# Patient Record
Sex: Female | Born: 1955 | Race: Asian | Hispanic: No | Marital: Married | Smoking: Never smoker
Health system: Southern US, Community
[De-identification: ages and names within clinical notes are randomized; demographics above are authoritative.]

## PROBLEM LIST (undated history)

## (undated) DIAGNOSIS — Z8719 Personal history of other diseases of the digestive system: Secondary | ICD-10-CM

## (undated) DIAGNOSIS — G43909 Migraine, unspecified, not intractable, without status migrainosus: Secondary | ICD-10-CM

## (undated) HISTORY — DX: Personal history of other diseases of the digestive system: Z87.19

---

## 2005-04-10 ENCOUNTER — Ambulatory Visit (HOSPITAL_COMMUNITY): Admission: RE | Admit: 2005-04-10 | Discharge: 2005-04-10 | Payer: Self-pay | Admitting: Obstetrics and Gynecology

## 2005-08-09 ENCOUNTER — Emergency Department (HOSPITAL_COMMUNITY): Admission: EM | Admit: 2005-08-09 | Discharge: 2005-08-09 | Payer: Self-pay | Admitting: Family Medicine

## 2006-04-08 ENCOUNTER — Emergency Department (HOSPITAL_COMMUNITY): Admission: EM | Admit: 2006-04-08 | Discharge: 2006-04-08 | Payer: Self-pay | Admitting: Emergency Medicine

## 2006-04-14 ENCOUNTER — Emergency Department (HOSPITAL_COMMUNITY): Admission: EM | Admit: 2006-04-14 | Discharge: 2006-04-14 | Payer: Self-pay | Admitting: Emergency Medicine

## 2006-04-20 ENCOUNTER — Ambulatory Visit: Payer: Self-pay | Admitting: Family Medicine

## 2006-04-28 ENCOUNTER — Ambulatory Visit: Payer: Self-pay | Admitting: Internal Medicine

## 2006-04-29 ENCOUNTER — Ambulatory Visit: Payer: Self-pay | Admitting: *Deleted

## 2006-06-18 ENCOUNTER — Ambulatory Visit: Payer: Self-pay | Admitting: Family Medicine

## 2006-06-27 ENCOUNTER — Emergency Department (HOSPITAL_COMMUNITY): Admission: EM | Admit: 2006-06-27 | Discharge: 2006-06-28 | Payer: Self-pay

## 2006-07-03 ENCOUNTER — Emergency Department (HOSPITAL_COMMUNITY): Admission: EM | Admit: 2006-07-03 | Discharge: 2006-07-03 | Payer: Self-pay | Admitting: Family Medicine

## 2006-07-04 ENCOUNTER — Emergency Department (HOSPITAL_COMMUNITY): Admission: EM | Admit: 2006-07-04 | Discharge: 2006-07-04 | Payer: Self-pay | Admitting: Emergency Medicine

## 2006-08-05 ENCOUNTER — Emergency Department (HOSPITAL_COMMUNITY): Admission: EM | Admit: 2006-08-05 | Discharge: 2006-08-05 | Payer: Self-pay | Admitting: Physician Assistant

## 2006-08-12 ENCOUNTER — Ambulatory Visit: Payer: Self-pay | Admitting: Family Medicine

## 2006-09-28 ENCOUNTER — Ambulatory Visit: Payer: Self-pay | Admitting: Family Medicine

## 2007-06-09 ENCOUNTER — Encounter (INDEPENDENT_AMBULATORY_CARE_PROVIDER_SITE_OTHER): Payer: Self-pay | Admitting: *Deleted

## 2008-07-24 ENCOUNTER — Ambulatory Visit: Payer: Self-pay | Admitting: Adult Health

## 2008-07-24 LAB — CONVERTED CEMR LAB
ALT: 117 units/L — ABNORMAL HIGH (ref 0–35)
AST: 85 units/L — ABNORMAL HIGH (ref 0–37)
Albumin: 4.7 g/dL (ref 3.5–5.2)
Basophils Absolute: 0 10*3/uL (ref 0.0–0.1)
Basophils Relative: 1 % (ref 0–1)
CO2: 24 meq/L (ref 19–32)
Calcium: 9.8 mg/dL (ref 8.4–10.5)
Chloride: 105 meq/L (ref 96–112)
Creatinine, Ser: 0.74 mg/dL (ref 0.40–1.20)
Lymphocytes Relative: 32 % (ref 12–46)
MCHC: 33.3 g/dL (ref 30.0–36.0)
Neutro Abs: 3.1 10*3/uL (ref 1.7–7.7)
Neutrophils Relative %: 56 % (ref 43–77)
Platelets: 215 10*3/uL (ref 150–400)
Potassium: 3.8 meq/L (ref 3.5–5.3)
RDW: 12.5 % (ref 11.5–15.5)
Sodium: 142 meq/L (ref 135–145)
Total Protein: 8 g/dL (ref 6.0–8.3)

## 2008-07-26 ENCOUNTER — Encounter (INDEPENDENT_AMBULATORY_CARE_PROVIDER_SITE_OTHER): Payer: Self-pay | Admitting: Adult Health

## 2008-07-26 LAB — CONVERTED CEMR LAB: Hep B S Ab: POSITIVE — AB

## 2008-11-24 ENCOUNTER — Emergency Department (HOSPITAL_COMMUNITY): Admission: EM | Admit: 2008-11-24 | Discharge: 2008-11-24 | Payer: Self-pay | Admitting: Family Medicine

## 2011-01-02 LAB — POCT I-STAT, CHEM 8
BUN: 10 mg/dL (ref 6–23)
Chloride: 105 mEq/L (ref 96–112)
Glucose, Bld: 98 mg/dL (ref 70–99)
HCT: 48 % — ABNORMAL HIGH (ref 36.0–46.0)
Potassium: 4 mEq/L (ref 3.5–5.1)

## 2011-02-07 NOTE — Assessment & Plan Note (Signed)
[facility]                           GASTROENTEROLOGY OFFICE NOTE   NAME:Karen Lee, Karen Lee                            MRN:          [id number]  DATE:04/28/2006                            DOB:          [birth year]    REASON FOR CONSULTATION:  Epigastric pain.   HISTORY:  This is a 55 year old non-English-speaking [place] female who  is referred through the [facility] Emergency Room for  evaluation of epigastric pain.  The patient was evaluated in the emergency  room on July 18, with headache and epigastric pain.  She was evaluated again  April 14, 2006, with epigastric pain.  The patient reports the discomfort has  been present for two to three weeks.  It is moderate in degree.  There is  intermittent nausea and vomiting on one occasion without blood.  Symtposm  seems to be worse at night.  It is not clear that her symptoms are affected  by meals.  There has been some bloating, as well.  The pain is focal and  nonradiating.  There is some heartburn and indigestion.  No dysphagia.  Appetite is stable.  Weight is stable.  No melena.  Bowel habits are  regular.  She was placed on Protonix via [facility].  She has been taking  40 mg daily and thinks this has been helpful.  The symptoms have not  entirely resolved.  As part of her workup, she underwent an abdominal  ultrasound  April 14, 2006.  This was normal.  CBC obtained July 18 and July  24, were both normal with a hemoglobin of 14.8.  As well, most recent  Comprehensive Metabolic Panel, including liver function tests, amylase and  lipase, as well as urinalysis, were unremarkable.  Helicobacter pylori  antibody was normal.   PAST MEDICAL HISTORY:  None known.   PAST SURGICAL HISTORY:  None reported.   ALLERGIES:  HYDROCODONE.   CURRENT MEDICATIONS:  1.  Protonix 40 mg daily.  2.  Benadryl 50 mg at night.  3.  Tylenol p.r.n.   FAMILY HISTORY:  The patient denies family history of  gastrointestinal  malignancy.   SOCIAL HISTORY:  The patient is married with five children.  She is  accompanied by her husband.  They are farmers by occupation.  She is a  [place] native and has been in the [place] approximately one year.  She does not smoke or use alcohol.   REVIEW OF SYSTEMS:  Per diagnostic evaluation form.   PHYSICAL EXAMINATION:  GENERAL:  Well-appearing female in no acute distress.  VITAL SIGNS:  Blood pressure 116/670, heart rate 60, weight 110.8 pounds.  She is 5 feet in height.  HEENT:  Sclerae anicteric.  Conjunctivae pink.  Oral mucosa intact.  No  adenopathy.  LUNGS:  Clear.  HEART:  Regular.  ABDOMEN:  Soft with tenderness, mass, or hernia.  Good bowel sounds heard.  EXTREMITIES:  Without edema.   IMPRESSION:  This is a 55 year old female with several-week history of  epigastric discomfort.  She does have reflux symptoms which may explain  her  pain.  Alternatively, she could have an ulcer, though Helicobacter pylori  antibody was negative.  In any event, she is improved on proton pump  inhibitor therapy.  I discussed today with the patient and her husband  additional workup including endoscopic evaluation.  However, they are  concerned about the cost and would prefer empiric therapy.  I think this is  reasonable as there are no alarm symptoms.  As such, I have provided her  with an additional six weeks of Protonix samples which she should take  daily.  If the symptoms do not resolve, then I think upper endoscopy would  be indicated.  All the above evaluation has been performed with the  assistance of a [place].  The patient and her husband clearly  understand the above impression and plans.  She will resume her general  medical care with [facility].                                   Wilhemina Bonito. Eda Keys., MD   JNP/MedQ  DD:  04/28/2006  DT:  04/28/2006  Job #:  [id number]   cc:   [employer]

## 2016-09-22 DIAGNOSIS — Z8719 Personal history of other diseases of the digestive system: Secondary | ICD-10-CM

## 2016-09-22 HISTORY — DX: Personal history of other diseases of the digestive system: Z87.19

## 2017-10-16 ENCOUNTER — Encounter: Payer: Self-pay | Admitting: Internal Medicine

## 2017-10-16 ENCOUNTER — Ambulatory Visit: Payer: Self-pay | Admitting: Internal Medicine

## 2017-10-16 VITALS — BP 140/80 | HR 60 | Resp 12 | Ht 59.0 in | Wt 133.0 lb

## 2017-10-16 DIAGNOSIS — K140 Glossitis: Secondary | ICD-10-CM

## 2017-10-16 NOTE — Progress Notes (Signed)
   Subjective:    Patient ID: Karen Lee, female    DOB: 07/26/1956, 62 y.o.   MRN: [id number]  HPI   Here to establish  Tongue pain on and off for 2-3 years.  6 days ago, she developed tongue pain episode.  This seems to occur every 2 weeks. So, basically, her tongue is painful every other week.  Has not had this evaluated anywhere else.   She has not been able to associate the pain with anything she eats or does sets this off. Does not use any medications. Was recommended to start Super B Complex with C  for possible glossitis by a friend who is a physician. No corticosteroid inhalers, no oral prednisone or antibiotics recently  No history of blood transfusion.  Eats only once to twice daily.  Generally salad with multiple vegetables and chicken.  Will eat rice and vegetables also.  Later, may eat 3 times daily, but tongue pain may limit. She does not feel she has lost weight with this.  Current Meds  Medication Sig  . B Complex-C (SUPER B COMPLEX/VITAMIN C PO) Take by mouth daily.    No Known Allergies  Social History   Socioeconomic History  . Marital status: Married    Spouse name: Not on file  . Number of children: 5  . Years of education: 0  . Highest education level: Not on file  Social Needs  . Financial resource strain: Not hard at all  . Food insecurity - worry: Never true  . Food insecurity - inability: Never true  . Transportation needs - medical: No  . Transportation needs - non-medical: No  Occupational History  . 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 Smoking status: Never Smoker  . Smokeless tobacco: Never Used  Substance and Sexual Activity  . Alcohol use: No    Frequency: Never  . Drug use: No  . Sexual activity: Not on file  Other Topics Concern  . Not on file  Social History Narrative  . Not on file     Review of Systems     Objective:   Physical Exam  NAD HEENT: PERRL, EOMI, TMs pearly gray, throat  without injection.  Mild inflammation/erythema along edge of tongue at distal aspect of tongue.  The inflammation extends along the curve of tongue tip on both sides for about 2 cm.  No exudate on tongue or buccal mucosa. Does have diffuse dental decay. No significant inflammation noted at labial folds bilaterally. Neck:  Supple, No adenopathy Chest:  CTA CV:  RRR with normal S1 and S2, No S3, S4 or murmur.  Radial and DP pulses normal and equal Abd:  S, NT, No HSM or mass, + BS Skin:  No rash or lesion.        Assessment & Plan:  1.  Tongue inflammation and pain:  Does not really appear to be fungal in origin.   Not clear she has cheilosis along with tongue irritation. Check RBC Folate, vitamin B12 level, CMP, CBC. HIV as well. To take Super B complex daily and follow up in a couple of months to see if any improvement. Call if worsens

## 2017-10-16 NOTE — Progress Notes (Signed)
Social Worker Intern performed a patient screening with an interpreter. The patient screening assess symptoms of depression and social determinant os health. Pt. Disclosed having trouble concentrating on things from time to time. No other concerns were registered. Social TEFL teacherWorker intern offered counseling services, but pt declined.

## 2017-10-16 NOTE — Patient Instructions (Signed)
Recommend taking B complex with 1 mg of Folic Acid (or at least 800 mcg daily)

## 2017-10-19 LAB — CBC WITH DIFFERENTIAL/PLATELET
BASOS: 1 %
Basophils Absolute: 0.1 10*3/uL (ref 0.0–0.2)
EOS (ABSOLUTE): 0.1 10*3/uL (ref 0.0–0.4)
Eos: 3 %
Hematocrit: 44.1 % (ref 34.0–46.6)
Hemoglobin: 14.8 g/dL (ref 11.1–15.9)
IMMATURE GRANULOCYTES: 1 %
Immature Grans (Abs): 0 10*3/uL (ref 0.0–0.1)
Lymphocytes Absolute: 1.5 10*3/uL (ref 0.7–3.1)
Lymphs: 28 %
MCH: 30 pg (ref 26.6–33.0)
MCHC: 33.6 g/dL (ref 31.5–35.7)
MCV: 90 fL (ref 79–97)
Monocytes Absolute: 0.6 10*3/uL (ref 0.1–0.9)
Monocytes: 11 %
NEUTROS PCT: 56 %
Neutrophils Absolute: 3.1 10*3/uL (ref 1.4–7.0)
Platelets: 201 10*3/uL (ref 150–379)
RBC: 4.93 x10E6/uL (ref 3.77–5.28)
RDW: 13.8 % (ref 12.3–15.4)
WBC: 5.4 10*3/uL (ref 3.4–10.8)

## 2017-10-19 LAB — COMPREHENSIVE METABOLIC PANEL
ALT: 53 IU/L — ABNORMAL HIGH (ref 0–32)
AST: 38 IU/L (ref 0–40)
Albumin/Globulin Ratio: 1.6 (ref 1.2–2.2)
Albumin: 4.9 g/dL — ABNORMAL HIGH (ref 3.6–4.8)
Alkaline Phosphatase: 65 IU/L (ref 39–117)
BILIRUBIN TOTAL: 0.5 mg/dL (ref 0.0–1.2)
BUN/Creatinine Ratio: 18 (ref 12–28)
BUN: 15 mg/dL (ref 8–27)
CHLORIDE: 100 mmol/L (ref 96–106)
CO2: 26 mmol/L (ref 20–29)
Calcium: 9.8 mg/dL (ref 8.7–10.3)
Creatinine, Ser: 0.84 mg/dL (ref 0.57–1.00)
GFR calc Af Amer: 86 mL/min/{1.73_m2} (ref >59)
GFR calc non Af Amer: 75 mL/min/{1.73_m2} (ref >59)
GLOBULIN, TOTAL: 3.1 g/dL (ref 1.5–4.5)
Glucose: 96 mg/dL (ref 65–99)
POTASSIUM: 4.4 mmol/L (ref 3.5–5.2)
SODIUM: 141 mmol/L (ref 134–144)
Total Protein: 8 g/dL (ref 6.0–8.5)

## 2017-10-19 LAB — FOLATE RBC
Folate, Hemolysate: 380.4 ng/mL
Folate, RBC: 863 ng/mL (ref >498)

## 2017-10-19 LAB — HIV ANTIBODY (ROUTINE TESTING W REFLEX): HIV Screen 4th Generation wRfx: NONREACTIVE

## 2017-10-19 LAB — VITAMIN B12: VITAMIN B 12: 821 pg/mL (ref 232–1245)

## 2017-10-27 ENCOUNTER — Ambulatory Visit (INDEPENDENT_AMBULATORY_CARE_PROVIDER_SITE_OTHER): Payer: Self-pay | Admitting: Internal Medicine

## 2017-10-27 ENCOUNTER — Encounter: Payer: Self-pay | Admitting: Internal Medicine

## 2017-10-27 VITALS — BP 130/88 | HR 64 | Resp 12 | Ht 59.0 in | Wt 133.0 lb

## 2017-10-27 DIAGNOSIS — K140 Glossitis: Secondary | ICD-10-CM

## 2017-10-27 MED ORDER — FLUCONAZOLE 100 MG PO TABS: ORAL_TABLET | ORAL | 0 refills | Status: DC

## 2017-10-27 NOTE — Patient Instructions (Signed)
Use lip balm without lanolin or smell or taste.

## 2017-10-27 NOTE — Progress Notes (Signed)
   Subjective:    Patient ID: Karen Lee, female    DOB: 10/08/1955, 62 y.o.   MRN: [id number]  HPI   Here today as her tongue became much worse yesterday morning.  I wanted to see what it looked like when she is having a flare. She does not recall any particular exposure before the inflammation of her tongue worsened. She has been taking the Super B complex.  Her husband accompanies her today.  Current Meds  Medication Sig  . B Complex-C (SUPER B COMPLEX/VITAMIN C PO) Take by mouth daily.  . ranitidine (ZANTAC) 150 MG tablet Take 150 mg by mouth 2 (two) times daily.      Review of Systems     Objective:   Physical Exam HEENT:  Edge of tip of tongue again a bit reddened with papillae more prominent.  No exudate on tongue or mucosa.  Generalized dental decay. Neck:  Supple, no adenopathy Chest : CTA CV:  RRR without murmur or rub.       Assessment & Plan:  Recurrent tongue inflammation mainly at tip edges. Will try a course of Fluconazole 100 mg daily for 14 days. Follow up subsequent to finishing course. ENT referral if does not resolve. Needs orange cared fo dental referral

## 2017-11-24 ENCOUNTER — Ambulatory Visit: Payer: Self-pay | Admitting: Internal Medicine

## 2017-11-24 ENCOUNTER — Encounter: Payer: Self-pay | Admitting: Internal Medicine

## 2017-11-24 VITALS — BP 130/90 | HR 66 | Resp 12 | Ht 59.0 in | Wt 136.0 lb

## 2017-11-24 DIAGNOSIS — K140 Glossitis: Secondary | ICD-10-CM

## 2017-11-24 NOTE — Progress Notes (Signed)
   Subjective:    Patient ID: Karen Lee, female    DOB: 03/16/1956, 62 y.o.   MRN: [id number]  HPI   Tongue inflammation and pain:  States much better after 14 day treatment of Fluconazole for possible fungal etiology.  Finished about 1 week ago.    No outpatient medications have been marked as taking for the 11/24/17 encounter (Office Visit) with Julieanne MansonMulberry, Payslie Mccaig, MD.    No Known Allergies  Review of Systems     Objective:   Physical Exam  No inflammation of tongue, just some hyperpigmentation left from where inflamed on tip previously.      Assessment & Plan:  Tongue Inflammation:  Resolved with 14 day course of Fluconazole. To call if recurs--may need to send to ENT.

## 2017-12-15 ENCOUNTER — Encounter: Payer: Self-pay | Admitting: Internal Medicine

## 2017-12-15 ENCOUNTER — Ambulatory Visit: Payer: Self-pay | Admitting: Internal Medicine

## 2017-12-15 VITALS — BP 130/80 | HR 60 | Resp 12 | Ht 59.0 in | Wt 135.0 lb

## 2017-12-15 DIAGNOSIS — K120 Recurrent oral aphthae: Secondary | ICD-10-CM

## 2017-12-15 MED ORDER — LIDOCAINE VISCOUS 2 % MT SOLN
OROMUCOSAL | 0 refills | Status: DC
Start: 2017-12-15 — End: 2018-02-24

## 2017-12-15 NOTE — Patient Instructions (Signed)
Brush teeth 3 times daily, but find a toothpaste without sodium laurel sulfate. Use mouthwash after brushing that does not contain alcohol Apply the 2% viscous lidocaine just to the area that is red and swollen and painful with a Qtip. Get your teeth cleaned by a dental clinic or [facility] dental hygienist program at least twice yearly and ask them if your teeth on the areas where you get the sores are sharp and irritating tongue--perhaps they can smooth the tooth surface out

## 2017-12-15 NOTE — Progress Notes (Signed)
   Subjective:    Patient ID: Karen Lee, female    DOB: 07-25-1956, 62 y.o.   MRN: [id number]  HPI   Patient seen on 11/24/2017 after 14 day course of oral Fluconazole and was doing well.  Last Friday, 5 days ago, she developed soreness on lateral right tongue and small white area on tongue at source of soreness. Has worsened with time, but remains only on right side.   Denies any history of other mucosal lesions:  Genital/anal, eyes, nasal. No skin rashes. Rarely has some bilateral lower abdominal pain.  Lasts only 15-20 minutes and then gone. No diarrhea, melena, or hematochezia No inflamed or swollen, painful joints.   Never with ulcerative lesions on outside of lips, but may have on mucosal side of lips. No fever or general malaise with the ulcers.    Dental care:  Brushes teeth 3 times daily  No outpatient medications have been marked as taking for the 12/15/17 encounter (Office Visit) with Mack Hook, MD.    No Known Allergies  Review of Systems     Objective:   Physical Exam NAD HEENT:  PERRL, EOMI, conjuntivae without injection, TMs pearly gray.  MMM.  3m ulcerative lesion on right mid lateral aspect of tongue with whitening of surrounding tissue and underlying mild inflammation.  No other oral lesions noted. Teeth with some decay and sharp areas on lingual aspect. Neck:  Supple, No adenopathy Chest:  CTA CV:  RRR without murmur or rub. Skin;  No rash.       Assessment & Plan:  1.  Aphthous ulcer of tongue:  Check ESR and ANA.  Viscous Lidocaine for control of pain. Discussed good dental care and will also refer to Dental to assess whether mechanical issue of tongue against teeth may be setting her up for these. Discussed dental products without laurel sulfate for use as well. Patient previously assessed for DM, vitamin deficiency, CBC, CMP for etiology for this without findings.

## 2017-12-16 LAB — ANA: Anti Nuclear Antibody(ANA): NEGATIVE

## 2017-12-16 LAB — SEDIMENTATION RATE: Sed Rate: 9 mm/hr (ref 0–40)

## 2017-12-21 ENCOUNTER — Other Ambulatory Visit: Payer: Self-pay

## 2018-01-07 ENCOUNTER — Telehealth: Payer: Self-pay

## 2018-01-07 NOTE — Telephone Encounter (Signed)
Husband called in stating patient is having really bad mouth pain. Husband was asking for appointment. States the Lidocaine solution is not working.   To Dr. Delrae AlfredMulberry to advise

## 2018-01-07 NOTE — Telephone Encounter (Signed)
Attempted call back to get more information.  Left message to call clinic and will come to me.

## 2018-01-17 DIAGNOSIS — K120 Recurrent oral aphthae: Secondary | ICD-10-CM | POA: Insufficient documentation

## 2018-01-28 NOTE — Telephone Encounter (Signed)
Please call this week and see how she is doing.  Tried to get hold of them 3 weeks ago and never received a return call.

## 2018-02-04 NOTE — Telephone Encounter (Signed)
Left message to call the office  and let us know how she is doing

## 2018-02-24 ENCOUNTER — Ambulatory Visit: Payer: Self-pay | Admitting: Internal Medicine

## 2018-02-24 ENCOUNTER — Encounter: Payer: Self-pay | Admitting: Internal Medicine

## 2018-02-24 VITALS — BP 122/80 | HR 72 | Resp 12 | Ht 59.0 in | Wt 131.0 lb

## 2018-02-24 DIAGNOSIS — Z8719 Personal history of other diseases of the digestive system: Secondary | ICD-10-CM | POA: Insufficient documentation

## 2018-02-24 DIAGNOSIS — K120 Recurrent oral aphthae: Secondary | ICD-10-CM

## 2018-02-24 DIAGNOSIS — Z13220 Encounter for screening for lipoid disorders: Secondary | ICD-10-CM

## 2018-02-24 DIAGNOSIS — Z1231 Encounter for screening mammogram for malignant neoplasm of breast: Secondary | ICD-10-CM

## 2018-02-24 DIAGNOSIS — R740 Nonspecific elevation of levels of transaminase and lactic acid dehydrogenase [LDH]: Secondary | ICD-10-CM

## 2018-02-24 DIAGNOSIS — Z1239 Encounter for other screening for malignant neoplasm of breast: Secondary | ICD-10-CM

## 2018-02-24 DIAGNOSIS — Z124 Encounter for screening for malignant neoplasm of cervix: Secondary | ICD-10-CM

## 2018-02-24 DIAGNOSIS — H7293 Unspecified perforation of tympanic membrane, bilateral: Secondary | ICD-10-CM

## 2018-02-24 DIAGNOSIS — R7401 Elevation of levels of liver transaminase levels: Secondary | ICD-10-CM

## 2018-02-24 DIAGNOSIS — Z0000 Encounter for general adult medical examination without abnormal findings: Secondary | ICD-10-CM

## 2018-02-24 LAB — POCT WET PREP WITH KOH
Clue Cells Wet Prep HPF POC: NEGATIVE
KOH PREP POC: NEGATIVE
RBC Wet Prep HPF POC: NEGATIVE
TRICHOMONAS UA: NEGATIVE
YEAST WET PREP PER HPF POC: NEGATIVE

## 2018-02-24 NOTE — Progress Notes (Signed)
Subjective:    Patient ID: Karen Lee, female    DOB: 03/20/1956, 62 y.o.   MRN: [id number]  HPI   CPE with pap Husband interpreting.  1.  Pap:  Has never had a pap smear.  Has been married to her husband since 1982-83.  No family history known for cervical cancer.  2.  Mammogram:  Only mammogram was 2006 and normal.  No family history of breast cancer.  Patient with history of right mastitis back in 1985.  3.  Osteoprevention:  No dairy products.  No family history of changes associated with osteoporosis and fractures of vertebrae or hip/wrist.  Outside in garden at times.  Also, walks in park 1.5 hours daily.  4.  Guaiac Cards:  Never.    5.  Colonoscopy:  Never.  No family history of colon cancer.  6.  Immunizations:  Did get immunizations before in 2005, but none since.     7.  Glucose/Cholesterol:  Blood sugar nonfasting below 100 in January.  Has not had cholesterol checked here.  Fasting today.  No outpatient medications have been marked as taking for the 02/24/18 encounter (Office Visit) with Julieanne MansonMulberry, Alexzandrea Normington, MD.   No Known Allergies   Past Medical History:  Diagnosis Date  . H/O oral aphthous ulcers 2018    History reviewed. No pertinent surgical history.   History reviewed. No pertinent family history.  Patient not aware of what caused death of her mother.  Her mother never really saw a doctor and just was ill in bed at end of life.  Social History   Socioeconomic History  . Marital status: Married    Spouse name: Mem  . Number of children: 5  . Years of education: 0  . Highest education level: Not on file  Occupational History  . 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 Financial resource strain: Not hard at all  . Food insecurity:    Worry: Never true    Inability: Never true  . Transportation needs:    Medical: No    Non-medical: No  Tobacco Use  . Smoking status: Never Smoker  . Smokeless tobacco: Never Used    Substance and Sexual Activity  . Alcohol use: No    Frequency: Never  . Drug use: No  . Sexual activity: Yes    Birth control/protection: None  Lifestyle  . Physical activity:    Days per week: 7 days    Minutes per session: 70 min  . Stress: Not at all  Relationships  . Social connections:    Talks on phone: Not on file    Gets together: Not on file    Attends religious service: Not on file    Active member of club or organization: Not on file    Attends meetings of clubs or organizations: Not on file    Relationship status: Not on file  . Intimate partner violence:    Fear of current or ex partner: No    Emotionally abused: No    Physically abused: Not on file    Forced sexual activity: Not on file  Other Topics Concern  . Not on file  Social History Narrative   In [place] 2005   Has not taken ESOL.  No transportation, but discussed different places where she could go.   Lives at home with 3 youngest children and her husband, who is custodian at [employer]    Review of Systems  All other systems reviewed and are negative.      Objective:   Physical Exam  Constitutional: She is oriented to person, place, and time. She appears well-developed and well-nourished.  HENT:  Head: Normocephalic and atraumatic.  Right Ear: Hearing, external ear and ear canal normal.  Left Ear: Hearing, external ear and ear canal normal.  Nose: Nose normal.  Mouth/Throat: Uvula is midline and oropharynx is clear and moist.  No tongue or oral lesions today. Bilateral TMs appear to either have significant anterior retraction or perforation  Eyes: Pupils are equal, round, and reactive to light. Conjunctivae and EOM are normal.  Discs sharp bilaterally  Neck: Normal range of motion and full passive range of motion without pain. Neck supple. No thyroid mass and no thyromegaly present.  Cardiovascular: Normal rate, regular rhythm, S1 normal and S2 normal. Exam reveals no S3, no S4  and no friction rub.  No murmur heard. No carotid bruits.  Carotid, radial, femoral, DP and PT pulses normal and equal.   Pulmonary/Chest: Effort normal and breath sounds normal.  Abdominal: Soft. Bowel sounds are normal. She exhibits no mass. There is no hepatosplenomegaly. There is no tenderness. No hernia.  Genitourinary: Rectal exam shows guaiac negative stool.  Genitourinary Comments: Normal external genitalia.  No vaginal or cervical lesions.  No uterine or adnexal mass or tenderness.   Rectal:  No mass, Heme negative light brown stool.  Musculoskeletal: Normal range of motion.  Lymphadenopathy:       Head (right side): No submental and no submandibular adenopathy present.       Head (left side): No submental and no submandibular adenopathy present.    She has no cervical adenopathy.    She has no axillary adenopathy.       Right: No inguinal and no supraclavicular adenopathy present.       Left: No inguinal and no supraclavicular adenopathy present.  Neurological: She is alert and oriented to person, place, and time. She has normal strength. No cranial nerve deficit or sensory deficit. She exhibits normal muscle tone. Coordination and gait normal.  Skin: Skin is warm. Capillary refill takes less than 2 seconds. No rash noted.  Psychiatric: She has a normal mood and affect. Her speech is normal and behavior is normal. Judgment and thought content normal. Cognition and memory are normal.         Assessment & Plan:  1.  CPE Schedule screening mammogram Pap FLP. To see if can find immunization record and bring in.  2.  Elevated ALT:  Hepatic profile.  3.  Perforated TMs vs retracted TMs:  ENT referral.  Dr. Jenne Pane    4.  Oral Aphthous ulcers, recurrent.  Currently without lesions.

## 2018-02-24 NOTE — Patient Instructions (Signed)
Dr. Kristine LineaEric Stadler's office:  [phone] for cleaning.

## 2018-02-25 LAB — LIPID PANEL W/O CHOL/HDL RATIO
CHOLESTEROL TOTAL: 222 mg/dL — AB (ref 100–199)
HDL: 50 mg/dL (ref >39)
LDL Calculated: 131 mg/dL — ABNORMAL HIGH (ref 0–99)
TRIGLYCERIDES: 204 mg/dL — AB (ref 0–149)
VLDL Cholesterol Cal: 41 mg/dL — ABNORMAL HIGH (ref 5–40)

## 2018-02-25 LAB — HEPATIC FUNCTION PANEL
ALK PHOS: 85 IU/L (ref 39–117)
ALT: 27 IU/L (ref 0–32)
AST: 28 IU/L (ref 0–40)
Albumin: 5 g/dL — ABNORMAL HIGH (ref 3.6–4.8)
BILIRUBIN, DIRECT: 0.11 mg/dL (ref 0.00–0.40)
Bilirubin Total: 0.4 mg/dL (ref 0.0–1.2)
Total Protein: 7.9 g/dL (ref 6.0–8.5)

## 2018-02-26 LAB — CYTOLOGY - PAP

## 2018-03-05 ENCOUNTER — Other Ambulatory Visit (INDEPENDENT_AMBULATORY_CARE_PROVIDER_SITE_OTHER): Payer: Self-pay

## 2018-03-05 DIAGNOSIS — Z1211 Encounter for screening for malignant neoplasm of colon: Secondary | ICD-10-CM

## 2018-03-05 LAB — POC HEMOCCULT BLD/STL (HOME/3-CARD/SCREEN)
Card #2 Fecal Occult Blod, POC: NEGATIVE
Card #3 Fecal Occult Blood, POC: NEGATIVE
FECAL OCCULT BLD: NEGATIVE

## 2018-03-15 ENCOUNTER — Ambulatory Visit: Payer: Self-pay | Admitting: Internal Medicine

## 2018-05-18 NOTE — Progress Notes (Signed)
[facility]. She will contact patient and schedule financial assistance appointment for patient

## 2018-06-13 DIAGNOSIS — H7293 Unspecified perforation of tympanic membrane, bilateral: Secondary | ICD-10-CM | POA: Insufficient documentation

## 2018-12-08 ENCOUNTER — Emergency Department (HOSPITAL_COMMUNITY)
Admission: EM | Admit: 2018-12-08 | Discharge: 2018-12-08 | Disposition: A | Discharge status: Home / Self Care | Payer: Self-pay | Attending: Emergency Medicine | Admitting: Emergency Medicine

## 2018-12-08 ENCOUNTER — Emergency Department (HOSPITAL_COMMUNITY): Payer: Self-pay

## 2018-12-08 ENCOUNTER — Encounter (HOSPITAL_COMMUNITY): Payer: Self-pay

## 2018-12-08 ENCOUNTER — Other Ambulatory Visit: Payer: Self-pay

## 2018-12-08 DIAGNOSIS — R079 Chest pain, unspecified: Secondary | ICD-10-CM | POA: Insufficient documentation

## 2018-12-08 DIAGNOSIS — R0602 Shortness of breath: Secondary | ICD-10-CM | POA: Insufficient documentation

## 2018-12-08 DIAGNOSIS — J40 Bronchitis, not specified as acute or chronic: Secondary | ICD-10-CM

## 2018-12-08 DIAGNOSIS — F419 Anxiety disorder, unspecified: Secondary | ICD-10-CM

## 2018-12-08 HISTORY — DX: Migraine, unspecified, not intractable, without status migrainosus: G43.909

## 2018-12-08 LAB — CBC WITH DIFFERENTIAL/PLATELET
Abs Immature Granulocytes: 0.06 10*3/uL (ref 0.00–0.07)
Basophils Absolute: 0.1 10*3/uL (ref 0.0–0.1)
Basophils Relative: 1 %
EOS PCT: 2 %
Eosinophils Absolute: 0.1 10*3/uL (ref 0.0–0.5)
HCT: 48.2 % — ABNORMAL HIGH (ref 36.0–46.0)
HEMOGLOBIN: 15.8 g/dL — AB (ref 12.0–15.0)
Immature Granulocytes: 1 %
Lymphocytes Relative: 21 %
Lymphs Abs: 1.9 10*3/uL (ref 0.7–4.0)
MCH: 30.5 pg (ref 26.0–34.0)
MCHC: 32.8 g/dL (ref 30.0–36.0)
MCV: 93.1 fL (ref 80.0–100.0)
Monocytes Absolute: 0.8 10*3/uL (ref 0.1–1.0)
Monocytes Relative: 9 %
Neutro Abs: 6 10*3/uL (ref 1.7–7.7)
Neutrophils Relative %: 66 %
Platelets: 204 10*3/uL (ref 150–400)
RBC: 5.18 MIL/uL — ABNORMAL HIGH (ref 3.87–5.11)
RDW: 12.3 % (ref 11.5–15.5)
WBC: 8.9 10*3/uL (ref 4.0–10.5)
nRBC: 0 % (ref 0.0–0.2)

## 2018-12-08 LAB — I-STAT TROPONIN, ED
Troponin i, poc: 0.01 ng/mL (ref 0.00–0.08)
Troponin i, poc: 0.02 ng/mL (ref 0.00–0.08)

## 2018-12-08 LAB — COMPREHENSIVE METABOLIC PANEL
ALK PHOS: 76 U/L (ref 38–126)
ALT: 33 U/L (ref 0–44)
AST: 30 U/L (ref 15–41)
Albumin: 4.6 g/dL (ref 3.5–5.0)
Anion gap: 10 (ref 5–15)
BUN: 14 mg/dL (ref 8–23)
CO2: 22 mmol/L (ref 22–32)
Calcium: 9.2 mg/dL (ref 8.9–10.3)
Chloride: 106 mmol/L (ref 98–111)
Creatinine, Ser: 0.95 mg/dL (ref 0.44–1.00)
GFR calc Af Amer: >60 mL/min (ref >60)
GFR calc non Af Amer: >60 mL/min (ref >60)
Glucose, Bld: 117 mg/dL — ABNORMAL HIGH (ref 70–99)
Potassium: 3.8 mmol/L (ref 3.5–5.1)
Sodium: 138 mmol/L (ref 135–145)
Total Bilirubin: 0.6 mg/dL (ref 0.3–1.2)
Total Protein: 8.4 g/dL — ABNORMAL HIGH (ref 6.5–8.1)

## 2018-12-08 LAB — D-DIMER, QUANTITATIVE: D-Dimer, Quant: 0.72 ug/mL-FEU — ABNORMAL HIGH (ref 0.00–0.50)

## 2018-12-08 MED ORDER — LORAZEPAM 1 MG PO TABS: 1.0000 mg | ORAL_TABLET | Freq: Two times a day (BID) | ORAL | 0 refills | Status: DC | PRN

## 2018-12-08 MED ORDER — SODIUM CHLORIDE 0.9% FLUSH: 3.0000 mL | Freq: Once | INTRAVENOUS | Status: DC

## 2018-12-08 MED ORDER — ALBUTEROL SULFATE (2.5 MG/3ML) 0.083% IN NEBU
5.0000 mg | INHALATION_SOLUTION | Freq: Once | RESPIRATORY_TRACT | Status: AC
  Administered 2018-12-08: 5 mg via RESPIRATORY_TRACT
  Filled 2018-12-08: qty 6

## 2018-12-08 MED ORDER — IOHEXOL 350 MG/ML SOLN
100.0000 mL | Freq: Once | INTRAVENOUS | Status: AC | PRN
  Administered 2018-12-08: 100 mL via INTRAVENOUS

## 2018-12-08 MED ORDER — METOPROLOL TARTRATE 5 MG/5ML IV SOLN
5.0000 mg | Freq: Once | INTRAVENOUS | Status: DC
  Filled 2018-12-08: qty 5

## 2018-12-08 MED ORDER — AEROCHAMBER Z-STAT PLUS/MEDIUM MISC
Status: AC
  Administered 2018-12-08: 23:00:00
  Filled 2018-12-08: qty 1

## 2018-12-08 MED ORDER — IPRATROPIUM BROMIDE 0.02 % IN SOLN
0.5000 mg | Freq: Once | RESPIRATORY_TRACT | Status: AC
  Administered 2018-12-08: 0.5 mg via RESPIRATORY_TRACT
  Filled 2018-12-08: qty 2.5

## 2018-12-08 MED ORDER — ALBUTEROL SULFATE HFA 108 (90 BASE) MCG/ACT IN AERS
2.0000 | INHALATION_SPRAY | Freq: Once | RESPIRATORY_TRACT | Status: AC
  Administered 2018-12-08: 2 via RESPIRATORY_TRACT
  Filled 2018-12-08: qty 6.7

## 2018-12-08 MED ORDER — LORAZEPAM 2 MG/ML IJ SOLN
1.0000 mg | Freq: Once | INTRAMUSCULAR | Status: AC
  Administered 2018-12-08: 1 mg via INTRAVENOUS
  Filled 2018-12-08: qty 1

## 2018-12-08 NOTE — ED Notes (Signed)
Lab to add on D-Dimer 

## 2018-12-08 NOTE — ED Triage Notes (Signed)
Patient states chest pain and SOB started 3 days ago. Patient denies any cough. Patient states she is having trouble exhaling.

## 2018-12-08 NOTE — ED Notes (Signed)
Provider at bedside

## 2018-12-08 NOTE — ED Notes (Signed)
Patient speaks Karen Lee and is not available on the Video interpreter. Patient's son is interpreting

## 2018-12-08 NOTE — Discharge Instructions (Signed)
Take ativan as needed for anxiety.   Use albuterol every 6 hrs as needed for trouble breathing.   See your doctor.   Return to ER if you have worse trouble breathing, shortness of breath, fever

## 2018-12-08 NOTE — ED Provider Notes (Signed)
[facility]-EMERGENCY DEPT Provider Note   CSN: [id number] Arrival date & time: 12/08/18  1748    History   Chief Complaint Chief Complaint  Patient presents with  . Chest Pain  . Shortness of Breath    HPI Karen Lee is a 63 y.o. female here presenting with chest pain, shortness of breath.  Patient states that she has some subjective shortness of breath and nonproductive cough for the last 4 days.  Patient denies any chest pain or chest pressure.  Patient states that she stays at home all the time and denies any recent travel or sick contacts.  Patient denies any history of heart attacks or blood clots.      The history is provided by the patient.    Past Medical History:  Diagnosis Date  . H/O oral aphthous ulcers 2018  . Migraine     Patient Active Problem List   Diagnosis Date Noted  . Perforation of both tympanic membranes 06/13/2018  . Elevated ALT measurement 02/24/2018  . H/O oral aphthous ulcers   . Oral aphthous ulcer 01/17/2018    History reviewed. No pertinent surgical history.   OB History   No obstetric history on file.      Home Medications    Prior to Admission medications   Not on File    Family History History reviewed. No pertinent family history.  Social History Social History   Tobacco Use  . Smoking status: Never Smoker  . Smokeless tobacco: Never Used  Substance Use Topics  . Alcohol use: No    Frequency: Never  . Drug use: No     Allergies   Patient has no known allergies.   Review of Systems Review of Systems  Respiratory: Positive for shortness of breath.   Cardiovascular: Positive for chest pain.  All other systems reviewed and are negative.    Physical Exam Updated Vital Signs BP 126/70   Pulse 64   Temp 99.1 F (37.3 C) (Oral)   Resp (!) 25   Ht 5' (1.524 m)   Wt 59 kg   SpO2 99%   BMI 25.39 kg/m   Physical Exam Vitals signs and nursing note reviewed.  HENT:     Head:  Normocephalic.  Eyes:     Extraocular Movements: Extraocular movements intact.     Pupils: Pupils are equal, round, and reactive to light.  Cardiovascular:     Rate and Rhythm: Normal rate and regular rhythm.     Heart sounds: Normal heart sounds.  Pulmonary:     Effort: Pulmonary effort is normal.     Breath sounds: Normal breath sounds.     Comments: Diminished bilaterally, minimal wheezing  Abdominal:     General: Bowel sounds are normal.     Palpations: Abdomen is soft.  Musculoskeletal: Normal range of motion.  Skin:    General: Skin is warm.     Capillary Refill: Capillary refill takes less than 2 seconds.  Neurological:     General: No focal deficit present.     Mental Status: She is alert and oriented to person, place, and time.      ED Treatments / Results  Labs (all labs ordered are listed, but only abnormal results are displayed) Labs Reviewed  CBC WITH DIFFERENTIAL/PLATELET - Abnormal; Notable for the following components:      Result Value   RBC 5.18 (*)    Hemoglobin 15.8 (*)    HCT 48.2 (*)    All  other components within normal limits  COMPREHENSIVE METABOLIC PANEL - Abnormal; Notable for the following components:   Glucose, Bld 117 (*)    Total Protein 8.4 (*)    All other components within normal limits  D-DIMER, QUANTITATIVE (NOT AT [facility]) - Abnormal; Notable for the following components:   D-Dimer, Quant 0.72 (*)    All other components within normal limits  I-STAT TROPONIN, ED  I-STAT TROPONIN, ED    EKG EKG Interpretation  Date/Time:  Wednesday December 08 2018 17:56:11 EDT Ventricular Rate:  87 PR Interval:    QRS Duration: 98 QT Interval:  333 QTC Calculation: 401 R Axis:   76 Text Interpretation:  Sinus rhythm Nonspecific repol abnormality, lateral leads No previous ECGs available Confirmed by Richardean Canal [phone]) on 12/08/2018 6:39:43 PM   Radiology Dg Chest 2 View  Result Date: 12/08/2018 CLINICAL DATA:  Chest pain and shortness of  breath for the past 3 days. EXAM: CHEST - 2 VIEW COMPARISON:  None. FINDINGS: The heart size and mediastinal contours are within normal limits. Normal pulmonary vascularity. No focal consolidation, pleural effusion, or pneumothorax. 1.2 cm nodular density in the right upper lobe. No acute osseous abnormality. IMPRESSION: 1.  No active cardiopulmonary disease. 2. 1.2 cm nodular density in the right upper lobe could reflect overlapping first costochondral degenerative changes, but a pulmonary nodule is not excluded. Recommend non-emergent chest CT without contrast for further evaluation. Electronically Signed   By: Obie Dredge M.D.   On: 12/08/2018 19:04   Ct Angio Chest Pe W And/or Wo Contrast  Result Date: 12/08/2018 CLINICAL DATA:  Shortness of breath question pulmonary embolism EXAM: CT ANGIOGRAPHY CHEST WITH CONTRAST TECHNIQUE: Multidetector CT imaging of the chest was performed using the standard protocol during bolus administration of intravenous contrast. Multiplanar CT image reconstructions and MIPs were obtained to evaluate the vascular anatomy. CONTRAST:  OMNIPAQUE IOHEXOL 350 MG/ML SOLN IV COMPARISON:  None FINDINGS: Cardiovascular: Aorta normal caliber without aneurysm or dissection. Pulmonary arteries adequately opacified and patent. No evidence of pulmonary embolism. No pericardial effusion. Mediastinum/Nodes: Esophagus unremarkable. Base of cervical region normal appearance. No thoracic adenopathy. Lungs/Pleura: Lungs clear. No pulmonary infiltrate, pleural effusion or pneumothorax. Upper Abdomen: No significant upper abdominal findings. Musculoskeletal: Osseous demineralization.  No acute bony findings. Review of the MIP images confirms the above findings. IMPRESSION: No evidence of pulmonary embolism. No acute intrathoracic process. Electronically Signed   By: Ulyses Southward M.D.   On: 12/08/2018 20:27    Procedures Procedures (including critical care time)  Medications Ordered in ED  Medications  sodium chloride flush (NS) 0.9 % injection 3 mL (has no administration in time range)  albuterol (PROVENTIL HFA;VENTOLIN HFA) 108 (90 Base) MCG/ACT inhaler 2 puff (has no administration in time range)  albuterol (PROVENTIL) (2.5 MG/3ML) 0.083% nebulizer solution 5 mg (5 mg Nebulization Given 12/08/18 2014)  ipratropium (ATROVENT) nebulizer solution 0.5 mg (0.5 mg Nebulization Given 12/08/18 2014)  iohexol (OMNIPAQUE) 350 MG/ML injection 100 mL (100 mLs Intravenous Contrast Given 12/08/18 1948)  LORazepam (ATIVAN) injection 1 mg (1 mg Intravenous Given 12/08/18 2051)     Initial Impression / Assessment and Plan / ED Course  I have reviewed the triage vital signs and the nursing notes.  Pertinent labs & imaging results that were available during my care of the patient were reviewed by me and considered in my medical decision making (see chart for details).       Alcie Jocson is a 63 y.o. female here  with SOB, nonproductive cough. Consider bronchitis vs pneumonia. Less likely ACS and I doubt PE or dissection. Will get labs, d-dimer, CXR.   9:54 PM D-dimer 0.72. CTA showed no PE. Trop neg x 2. Felt better with albuterol. She was anxious and improved with ativan. I think likely bronchitis vs panic attacks. Will dc home with albuterol, prn ativan.   Final Clinical Impressions(s) / ED Diagnoses   Final diagnoses:  None    ED Discharge Orders    None       Charlynne Pander, MD 12/08/18 2155

## 2018-12-16 ENCOUNTER — Other Ambulatory Visit: Payer: Self-pay

## 2018-12-16 ENCOUNTER — Emergency Department (HOSPITAL_COMMUNITY): Payer: Self-pay

## 2018-12-16 ENCOUNTER — Emergency Department (HOSPITAL_COMMUNITY)
Admission: EM | Admit: 2018-12-16 | Discharge: 2018-12-16 | Disposition: A | Discharge status: Home / Self Care | Payer: Self-pay | Attending: Emergency Medicine | Admitting: Emergency Medicine

## 2018-12-16 ENCOUNTER — Encounter (HOSPITAL_COMMUNITY): Payer: Self-pay

## 2018-12-16 DIAGNOSIS — Z79899 Other long term (current) drug therapy: Secondary | ICD-10-CM | POA: Insufficient documentation

## 2018-12-16 DIAGNOSIS — R1013 Epigastric pain: Secondary | ICD-10-CM | POA: Insufficient documentation

## 2018-12-16 LAB — CBC WITH DIFFERENTIAL/PLATELET
Abs Immature Granulocytes: 0.13 10*3/uL — ABNORMAL HIGH (ref 0.00–0.07)
BASOS ABS: 0.1 10*3/uL (ref 0.0–0.1)
Basophils Relative: 1 %
Eosinophils Absolute: 0.1 10*3/uL (ref 0.0–0.5)
Eosinophils Relative: 1 %
HCT: 43.9 % (ref 36.0–46.0)
Hemoglobin: 14.3 g/dL (ref 12.0–15.0)
Immature Granulocytes: 1 %
LYMPHS PCT: 14 %
Lymphs Abs: 1.3 10*3/uL (ref 0.7–4.0)
MCH: 29.9 pg (ref 26.0–34.0)
MCHC: 32.6 g/dL (ref 30.0–36.0)
MCV: 91.8 fL (ref 80.0–100.0)
Monocytes Absolute: 0.8 10*3/uL (ref 0.1–1.0)
Monocytes Relative: 8 %
NRBC: 0 % (ref 0.0–0.2)
Neutro Abs: 7.1 10*3/uL (ref 1.7–7.7)
Neutrophils Relative %: 75 %
Platelets: 188 10*3/uL (ref 150–400)
RBC: 4.78 MIL/uL (ref 3.87–5.11)
RDW: 12.1 % (ref 11.5–15.5)
WBC: 9.4 10*3/uL (ref 4.0–10.5)

## 2018-12-16 LAB — BASIC METABOLIC PANEL
Anion gap: 10 (ref 5–15)
BUN: 20 mg/dL (ref 8–23)
CO2: 23 mmol/L (ref 22–32)
CREATININE: 1.17 mg/dL — AB (ref 0.44–1.00)
Calcium: 8.7 mg/dL — ABNORMAL LOW (ref 8.9–10.3)
Chloride: 99 mmol/L (ref 98–111)
GFR calc non Af Amer: 50 mL/min — ABNORMAL LOW (ref >60)
GFR, EST AFRICAN AMERICAN: 57 mL/min — AB (ref >60)
Glucose, Bld: 121 mg/dL — ABNORMAL HIGH (ref 70–99)
Potassium: 4.1 mmol/L (ref 3.5–5.1)
Sodium: 132 mmol/L — ABNORMAL LOW (ref 135–145)

## 2018-12-16 LAB — TROPONIN I: Troponin I: <0.03 ng/mL (ref <0.03)

## 2018-12-16 MED ORDER — OMEPRAZOLE 20 MG PO CPDR: 20.0000 mg | DELAYED_RELEASE_CAPSULE | Freq: Every day | ORAL | 0 refills | Status: DC

## 2018-12-16 MED ORDER — LORAZEPAM 1 MG PO TABS
1.0000 mg | ORAL_TABLET | Freq: Once | ORAL | Status: AC
  Administered 2018-12-16: 1 mg via ORAL
  Filled 2018-12-16: qty 1

## 2018-12-16 NOTE — ED Provider Notes (Signed)
[facility]-EMERGENCY DEPT Provider Note   CSN: [id number] Arrival date & time: 12/16/18  0043    History   Chief Complaint Chief Complaint  Patient presents with  . Shortness of Breath    HPI Karen Lee is a 63 y.o. female.     Patient presents to the emergency department with a chief complaint of shortness of breath.  She states that she was seen she denies any chest pain, fever, chills, cough.  Denies any recent travel.  Denies any known exposures to anyone sick or anyone with COVID-19.  She states that she feels a tightness in her epigastric region, but denies any burning in her chest.  Denies any worsening symptoms with eating or drinking.  Denies any treatments prior to arrival.  Denies any other associated symptoms.  The history is provided by the patient. No language interpreter was used.    Past Medical History:  Diagnosis Date  . H/O oral aphthous ulcers 2018  . Migraine     Patient Active Problem List   Diagnosis Date Noted  . Perforation of both tympanic membranes 06/13/2018  . Elevated ALT measurement 02/24/2018  . H/O oral aphthous ulcers   . Oral aphthous ulcer 01/17/2018    History reviewed. No pertinent surgical history.   OB History   No obstetric history on file.      Home Medications    Prior to Admission medications   Medication Sig Start Date End Date Taking? Authorizing Provider  LORazepam (ATIVAN) 1 MG tablet Take 1 tablet (1 mg total) by mouth 2 (two) times daily as needed for anxiety. 12/08/18   Charlynne Pander, MD    Family History History reviewed. No pertinent family history.  Social History Social History   Tobacco Use  . Smoking status: Never Smoker  . Smokeless tobacco: Never Used  Substance Use Topics  . Alcohol use: No    Frequency: Never  . Drug use: No     Allergies   Patient has no known allergies.   Review of Systems Review of Systems  All other systems reviewed and are negative.    Physical Exam Updated Vital Signs BP (!) 152/72 (BP Location: Left Arm)   Pulse 75   Temp 97.7 F (36.5 C) (Oral)   Resp 20   Ht 5\' 1"  (1.549 m)   Wt 61.2 kg   SpO2 99%   BMI 25.51 kg/m   Physical Exam Vitals signs and nursing note reviewed.  Constitutional:      Appearance: She is well-developed.  HENT:     Head: Normocephalic and atraumatic.  Eyes:     Conjunctiva/sclera: Conjunctivae normal.     Pupils: Pupils are equal, round, and reactive to light.  Neck:     Musculoskeletal: Normal range of motion and neck supple.  Cardiovascular:     Rate and Rhythm: Normal rate and regular rhythm.     Heart sounds: No murmur. No friction rub. No gallop.   Pulmonary:     Effort: Pulmonary effort is normal. No respiratory distress.     Breath sounds: Normal breath sounds. No wheezing or rales.     Comments: Sounds are clear to auscultation bilaterally Chest:     Chest wall: No tenderness.  Abdominal:     General: Bowel sounds are normal. There is no distension.     Palpations: Abdomen is soft. There is no mass.     Tenderness: There is no abdominal tenderness. There is no guarding or  rebound.  Musculoskeletal: Normal range of motion.        General: No tenderness.  Skin:    General: Skin is warm and dry.  Neurological:     Mental Status: She is alert and oriented to person, place, and time.  Psychiatric:        Behavior: Behavior normal.        Thought Content: Thought content normal.        Judgment: Judgment normal.      ED Treatments / Results  Labs (all labs ordered are listed, but only abnormal results are displayed) Labs Reviewed - No data to display  EKG EKG Interpretation  Date/Time:  Thursday December 16 2018 01:47:36 EDT Ventricular Rate:  68 PR Interval:    QRS Duration: 77 QT Interval:  413 QTC Calculation: 440 R Axis:   70 Text Interpretation:  Sinus rhythm No acute changes No significant change since last tracing Nonspecific ST and T wave  abnormality Confirmed by Derwood Kaplan ([id number]) on 12/16/2018 5:46:17 AM   Radiology Dg Chest 2 View  Result Date: 12/16/2018 CLINICAL DATA:  Shortness of breath. EXAM: CHEST - 2 VIEW COMPARISON:  Radiographs and CT 8 days ago 03/18/20209 FINDINGS: The cardiomediastinal contours are normal. The lungs are clear. Pulmonary vasculature is normal. No consolidation, pleural effusion, or pneumothorax. No acute osseous abnormalities are seen. IMPRESSION: No acute chest findings. Electronically Signed   By: Narda Rutherford M.D.   On: 12/16/2018 01:41    Procedures Procedures (including critical care time)  Medications Ordered in ED Medications - No data to display   Initial Impression / Assessment and Plan / ED Course  I have reviewed the triage vital signs and the nursing notes.  Pertinent labs & imaging results that were available during my care of the patient were reviewed by me and considered in my medical decision making (see chart for details).        Patient with persistent shortness of breath.  She has had this for the past several days.  Was seen on 3/18 for the same.  Had negative PE study at that time.  She denies having had any chest pain.  I doubt ACS.  She does complain of some pressure in her epigastrium.  Symptoms could be anxiety driven, will give a dose of Ativan, will also check labs, EKG, and chest x-ray.   Laboratory work-up remarkable for mild dehydration, no evidence of infection, troponin is negative, no ischemic changes on EKG.  Recent PE study was negative.  Patient is in no acute distress.  Her vital signs are stable.  She feels improved after dose of Ativan.  Could be anxiety driven, but also given the location question GERD.  Will prescribe omeprazole.  Urged to follow-up with PCP.   Final Clinical Impressions(s) / ED Diagnoses   Final diagnoses:  Epigastric discomfort    ED Discharge Orders         Ordered    omeprazole (PRILOSEC) 20 MG capsule  Daily      12/16/18 0231           Roxy Horseman, PA-C 12/16/18 1749    Derwood Kaplan, MD 12/16/18 [id number]

## 2018-12-16 NOTE — ED Notes (Addendum)
Pt verbalized discharge instructions and follow up care. Alert and ambulatory. No IV. Leaving with daughter   

## 2018-12-16 NOTE — Discharge Instructions (Addendum)
No clear explanation was found for your symptoms tonight.  Your blood work, x-ray, and EKG are reassuring.  Please drink more water.  Please take the medication as prescribed and follow-up with your doctor.

## 2018-12-16 NOTE — ED Triage Notes (Signed)
Pt reports SOB. She states that it feels like she cannot get all of her air out. She states that it was similar to what happened when she was seen 3 days ago. She states that this episode started around 5p. Denies cough.

## 2019-07-23 IMAGING — CT CT ANGIOGRAPHY CHEST
2 of 8 series · 19 of 46 positions shown · IV contrast (OMNIPAQUE)
Comparison: None

CLINICAL DATA: Shortness of breath question pulmonary embolism

EXAM:
CT ANGIOGRAPHY CHEST WITH CONTRAST
TECHNIQUE: Multidetector CT imaging of the chest was performed using the
standard protocol during bolus administration of intravenous
contrast. Multiplanar CT image reconstructions and MIPs were
obtained to evaluate the vascular anatomy.
CONTRAST:  100mL OMNIPAQUE IOHEXOL 350 MG/ML SOLN IV

[Series 4: thins · axial · 0.59mm/px · z∈[+1413,+1686]mm · 18 of 300 slices shown]
[im 14/300  lung]
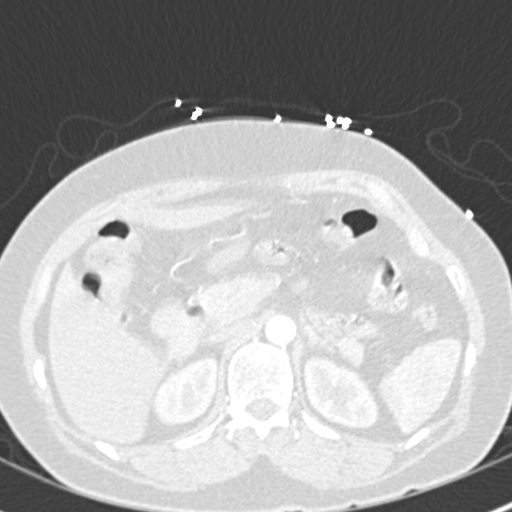
[im 27/300  soft-tissue]
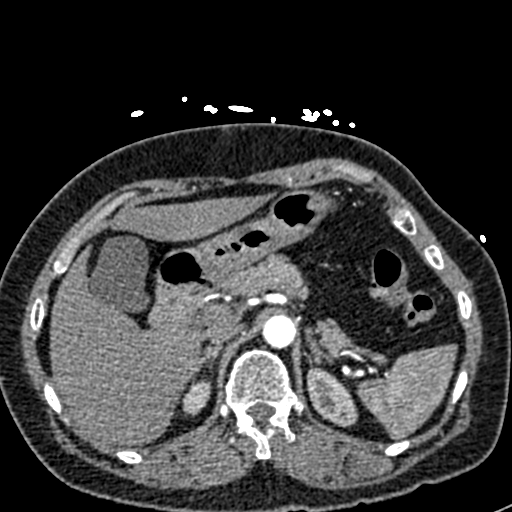
[im 53/300  lung]
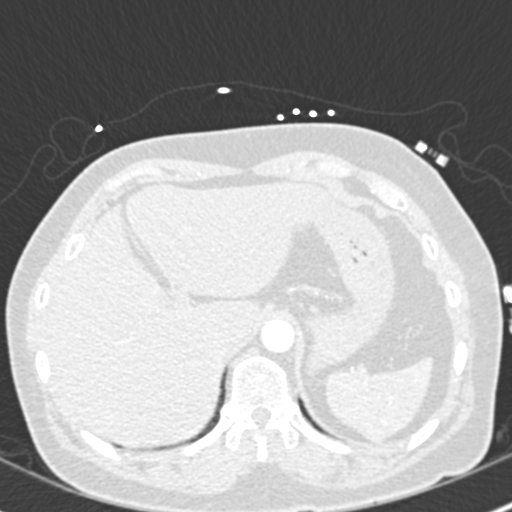
[im 66/300  soft-tissue]
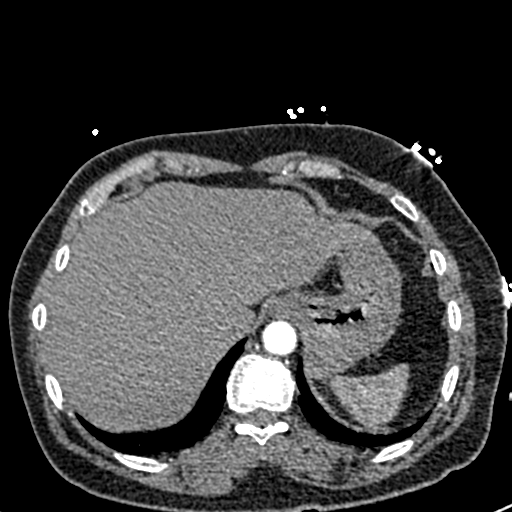
[im 79/300  lung]
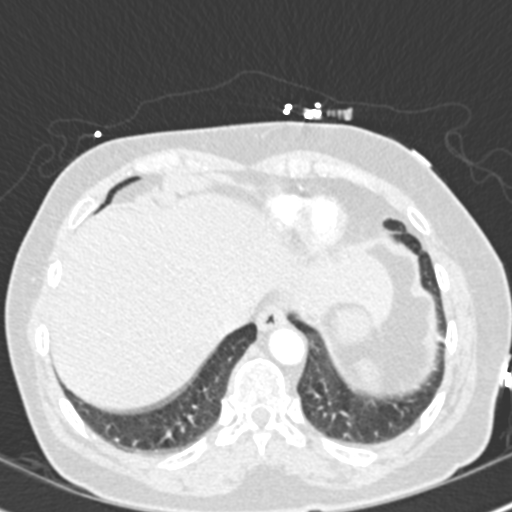
[im 92/300  soft-tissue]
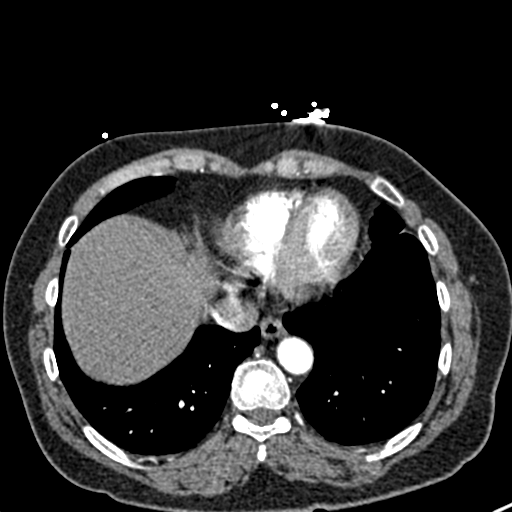
[im 105/300  lung]
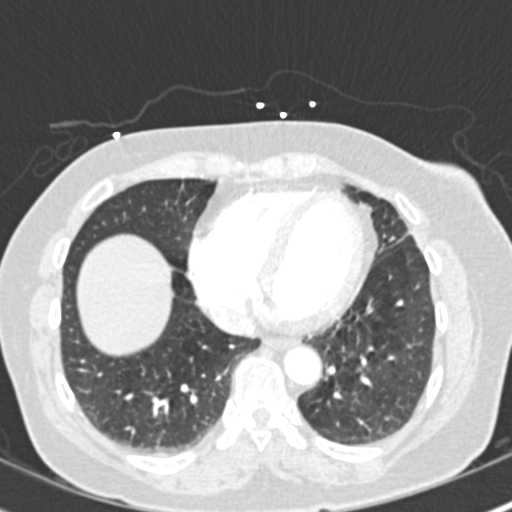
[im 131/300  soft-tissue]
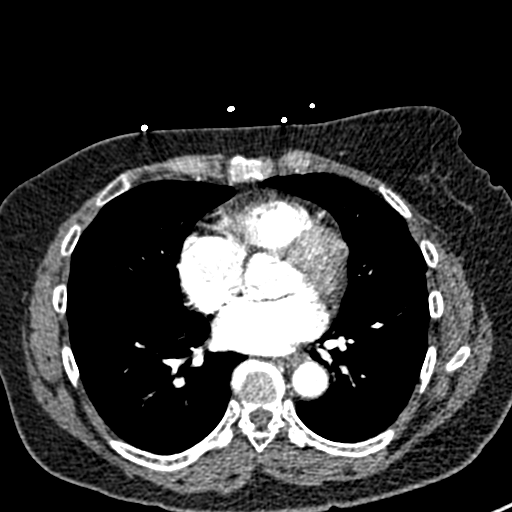
[im 144/300  lung]
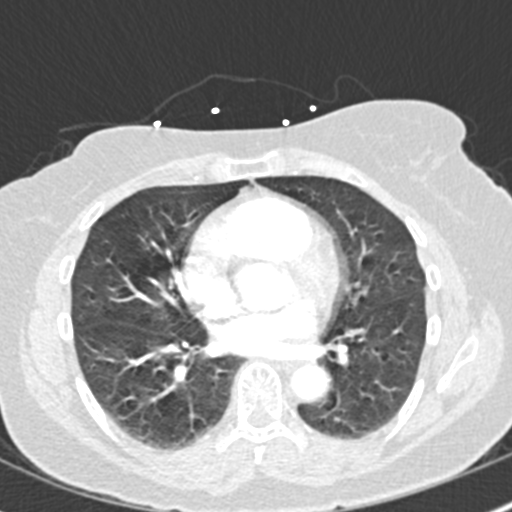
[im 157/300  soft-tissue]
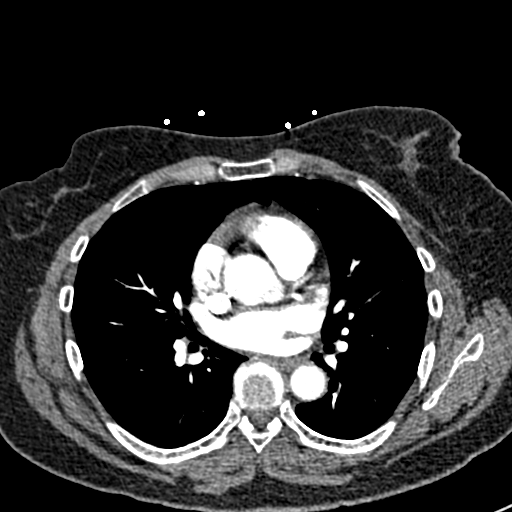
[im 170/300  lung]
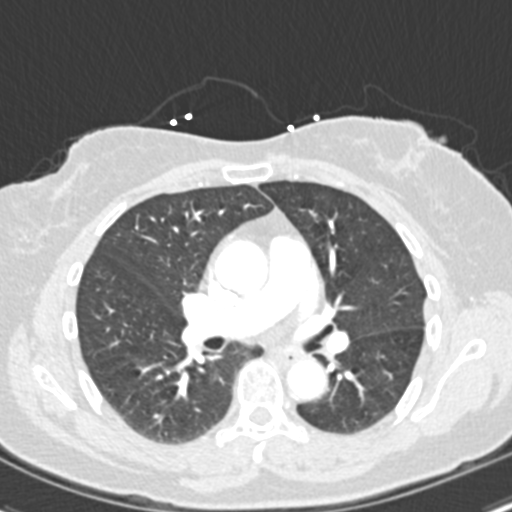
[im 196/300  soft-tissue]
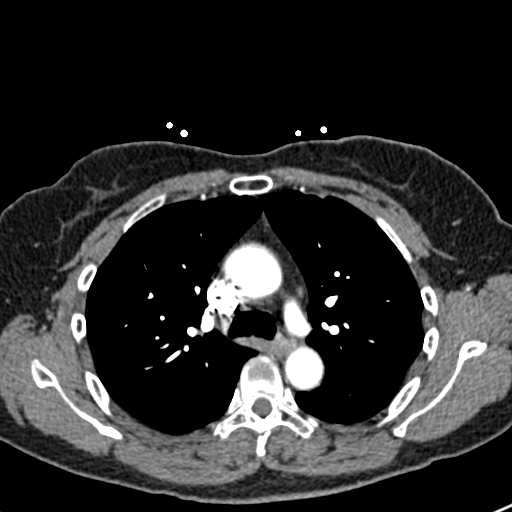
[im 209/300  lung]
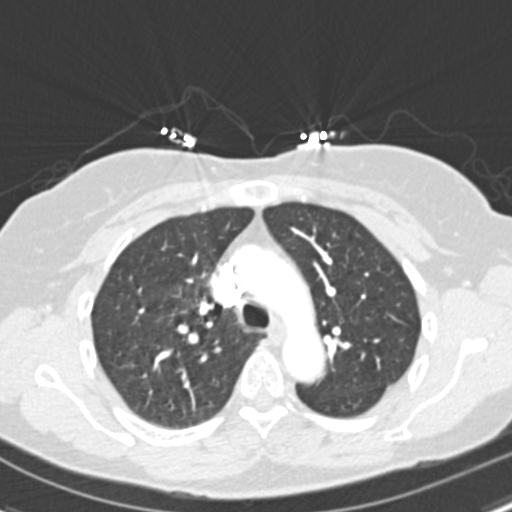
[im 222/300  soft-tissue]
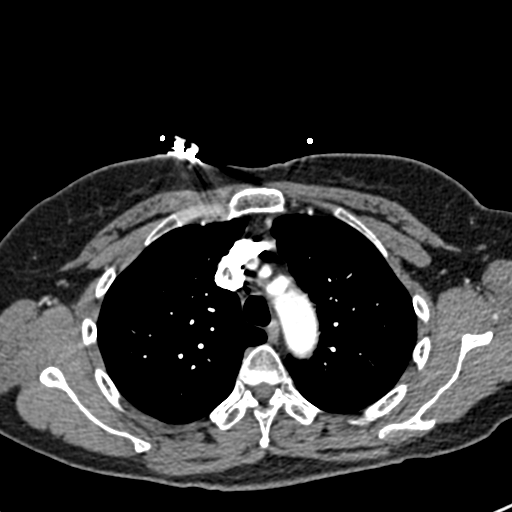
[im 235/300  lung]
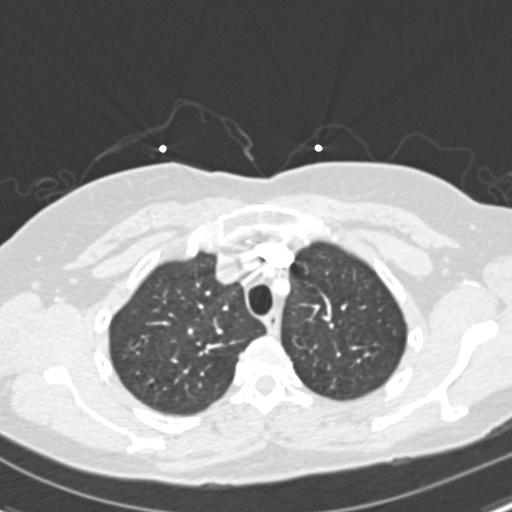
[im 248/300  soft-tissue]
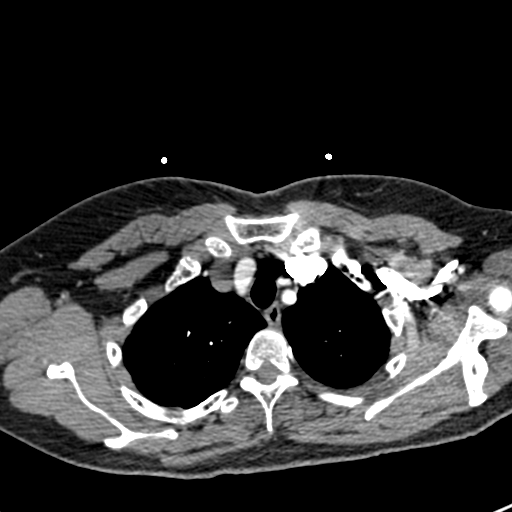
[im 274/300  lung]
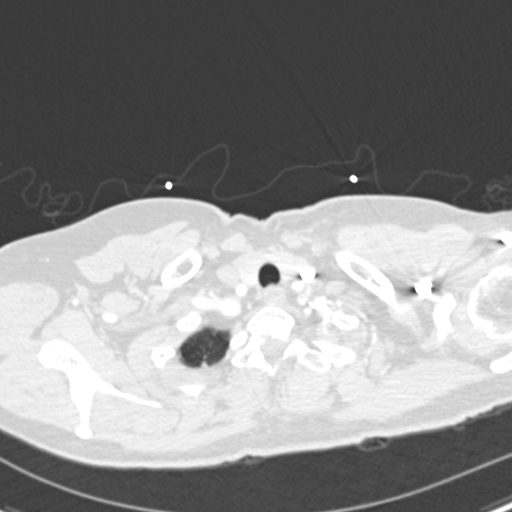
[im 287/300  soft-tissue]
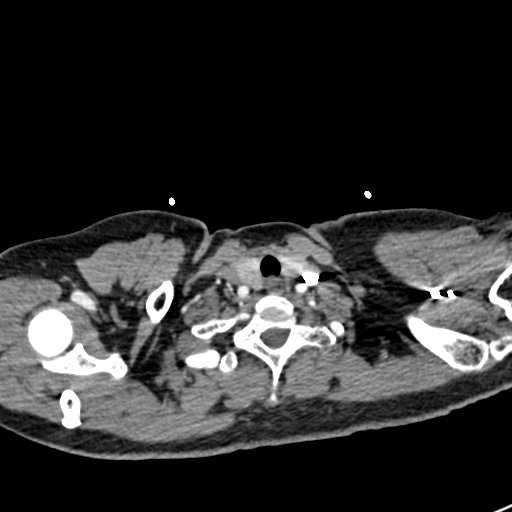

[Series 6: coronal mpr · coronal · 0.56mm/px · 1 of 107 slices shown]
[im 54/107  soft-tissue]
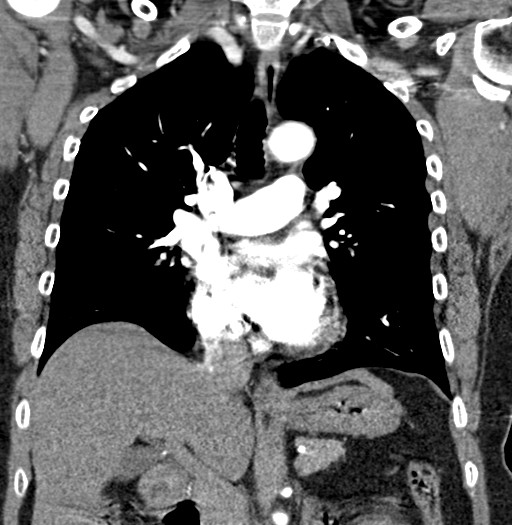

[19 of 46 positions shown; findings below may reference images not displayed]

FINDINGS: Cardiovascular: Aorta normal caliber without aneurysm or dissection.
Pulmonary arteries adequately opacified and patent. No evidence of
pulmonary embolism. No pericardial effusion.

Mediastinum/Nodes: Esophagus unremarkable. Base of cervical region
normal appearance. No thoracic adenopathy.

Lungs/Pleura: Lungs clear. No pulmonary infiltrate, pleural effusion
or pneumothorax.

Upper Abdomen: No significant upper abdominal findings.

Musculoskeletal: Osseous demineralization.  No acute bony findings.

Review of the MIP images confirms the above findings.
IMPRESSION: No evidence of pulmonary embolism.

No acute intrathoracic process.

## 2019-07-31 IMAGING — CR CHEST - 2 VIEW
2 series · 2 of 2 positions shown · non-contrast
Comparison: Radiographs and CT 8 days ago [DATE]

CLINICAL DATA: Shortness of breath.

EXAM:
CHEST - 2 VIEW

[w chest lat]
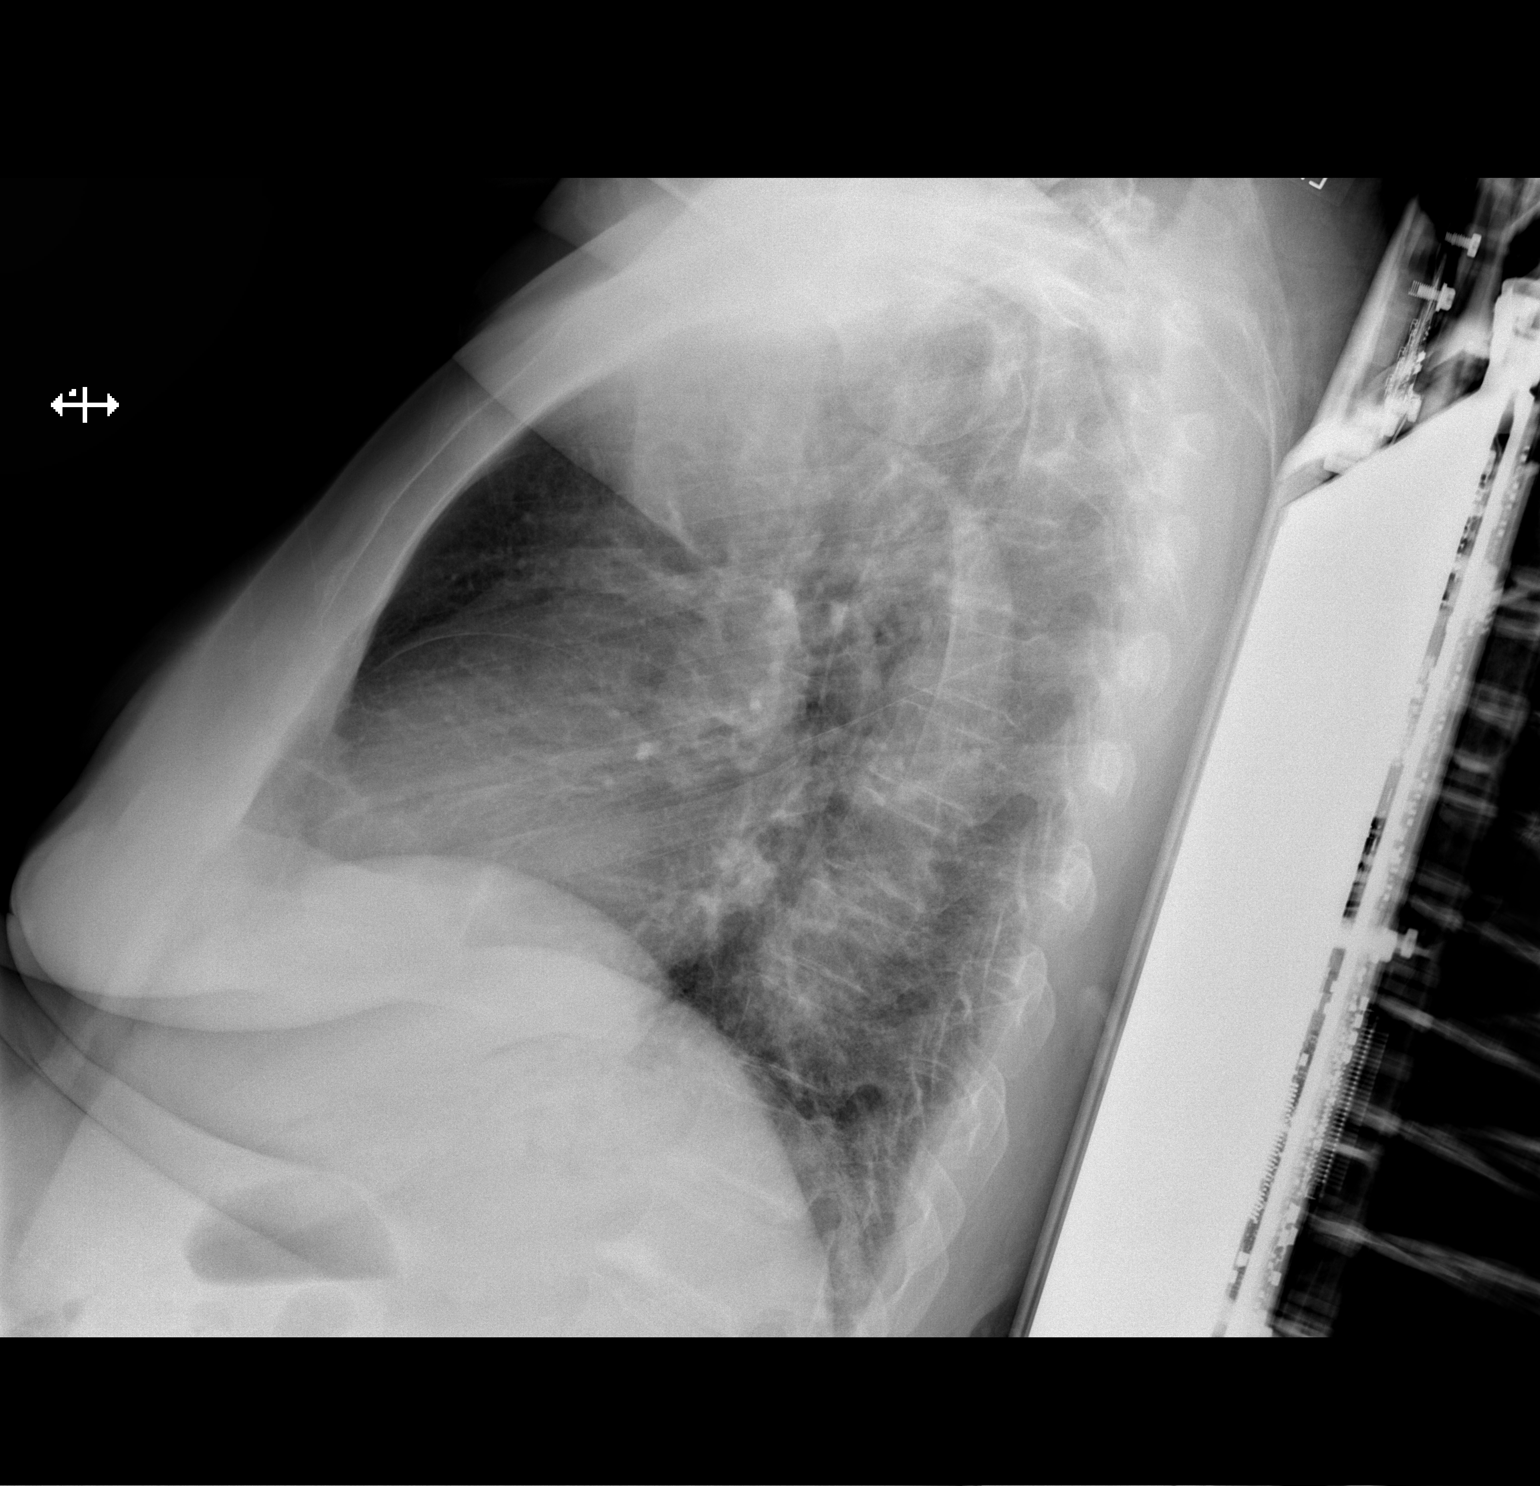

[x chest ap]
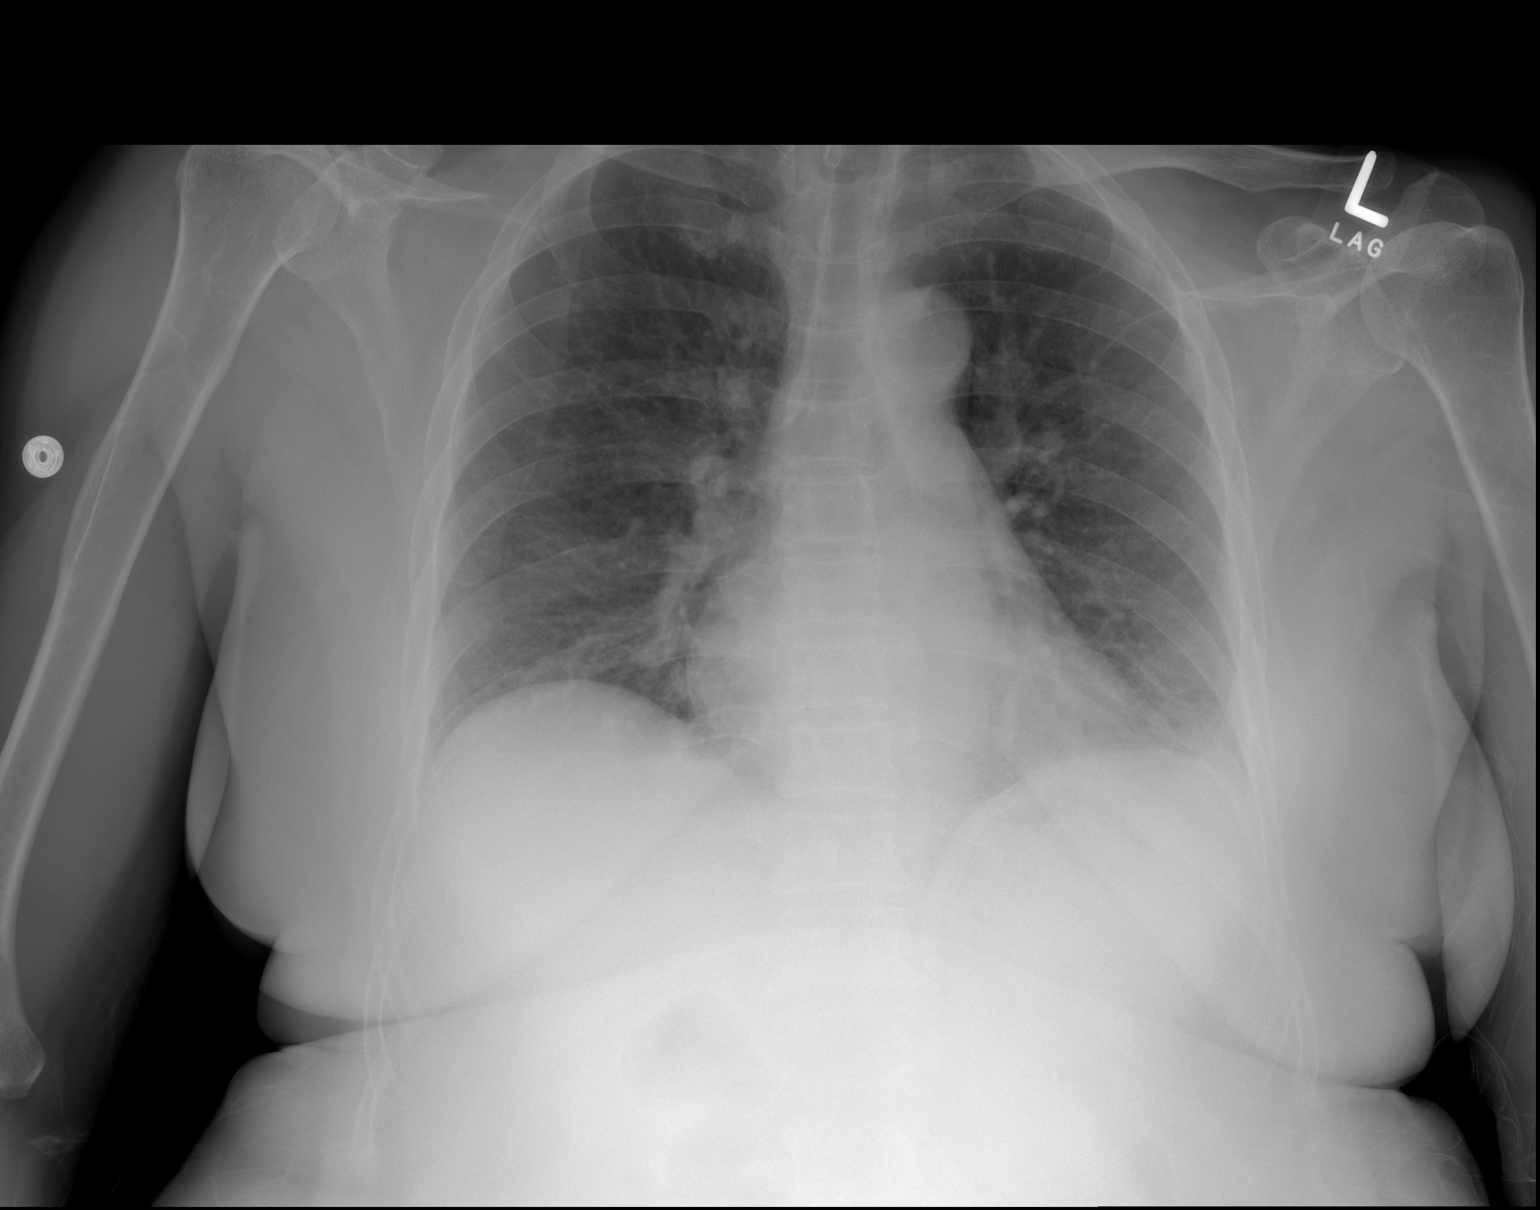

[2 of 2 positions shown; findings below may reference images not displayed]

FINDINGS: The cardiomediastinal contours are normal. The lungs are clear.
Pulmonary vasculature is normal. No consolidation, pleural effusion,
or pneumothorax. No acute osseous abnormalities are seen.
IMPRESSION: No acute chest findings.

## 2023-02-08 ENCOUNTER — Emergency Department (HOSPITAL_COMMUNITY): Payer: Self-pay

## 2023-02-08 ENCOUNTER — Inpatient Hospital Stay (HOSPITAL_COMMUNITY)
Admission: EM | Admit: 2023-02-08 | Discharge: 2023-02-10 | DRG: 392 | Disposition: A | Discharge status: Home / Self Care | Payer: Self-pay | Attending: Internal Medicine | Admitting: Internal Medicine

## 2023-02-08 ENCOUNTER — Encounter (HOSPITAL_COMMUNITY): Payer: Self-pay | Admitting: Internal Medicine

## 2023-02-08 ENCOUNTER — Other Ambulatory Visit: Payer: Self-pay

## 2023-02-08 DIAGNOSIS — K5720 Diverticulitis of large intestine with perforation and abscess without bleeding: Principal | ICD-10-CM | POA: Diagnosis present

## 2023-02-08 DIAGNOSIS — K5792 Diverticulitis of intestine, part unspecified, without perforation or abscess without bleeding: Secondary | ICD-10-CM | POA: Diagnosis present

## 2023-02-08 DIAGNOSIS — Z6829 Body mass index (BMI) 29.0-29.9, adult: Secondary | ICD-10-CM

## 2023-02-08 DIAGNOSIS — R739 Hyperglycemia, unspecified: Secondary | ICD-10-CM | POA: Diagnosis present

## 2023-02-08 DIAGNOSIS — E663 Overweight: Secondary | ICD-10-CM | POA: Diagnosis present

## 2023-02-08 DIAGNOSIS — Z603 Acculturation difficulty: Secondary | ICD-10-CM | POA: Diagnosis present

## 2023-02-08 LAB — URINALYSIS, ROUTINE W REFLEX MICROSCOPIC
Bacteria, UA: NONE SEEN
Bilirubin Urine: NEGATIVE
Glucose, UA: NEGATIVE mg/dL
Hgb urine dipstick: NEGATIVE
Ketones, ur: NEGATIVE mg/dL
Nitrite: NEGATIVE
Protein, ur: NEGATIVE mg/dL
Specific Gravity, Urine: 1.008 (ref 1.005–1.030)
pH: 8 (ref 5.0–8.0)

## 2023-02-08 LAB — CBC WITH DIFFERENTIAL/PLATELET
Abs Immature Granulocytes: 0.03 10*3/uL (ref 0.00–0.07)
Basophils Absolute: 0.1 10*3/uL (ref 0.0–0.1)
Basophils Relative: 0 %
Eosinophils Absolute: 0.2 10*3/uL (ref 0.0–0.5)
Eosinophils Relative: 2 %
HCT: 44.2 % (ref 36.0–46.0)
Hemoglobin: 14.2 g/dL (ref 12.0–15.0)
Immature Granulocytes: 0 %
Lymphocytes Relative: 15 %
Lymphs Abs: 1.8 10*3/uL (ref 0.7–4.0)
MCH: 29.9 pg (ref 26.0–34.0)
MCHC: 32.1 g/dL (ref 30.0–36.0)
MCV: 93.1 fL (ref 80.0–100.0)
Monocytes Absolute: 1.2 10*3/uL — ABNORMAL HIGH (ref 0.1–1.0)
Monocytes Relative: 10 %
Neutro Abs: 8.7 10*3/uL — ABNORMAL HIGH (ref 1.7–7.7)
Neutrophils Relative %: 73 %
Platelets: 177 10*3/uL (ref 150–400)
RBC: 4.75 MIL/uL (ref 3.87–5.11)
RDW: 12.2 % (ref 11.5–15.5)
WBC: 12 10*3/uL — ABNORMAL HIGH (ref 4.0–10.5)
nRBC: 0 % (ref 0.0–0.2)

## 2023-02-08 LAB — COMPREHENSIVE METABOLIC PANEL
ALT: 21 U/L (ref 0–44)
AST: 23 U/L (ref 15–41)
Albumin: 4.1 g/dL (ref 3.5–5.0)
Alkaline Phosphatase: 67 U/L (ref 38–126)
Anion gap: 10 (ref 5–15)
BUN: 10 mg/dL (ref 8–23)
CO2: 25 mmol/L (ref 22–32)
Calcium: 8.7 mg/dL — ABNORMAL LOW (ref 8.9–10.3)
Chloride: 103 mmol/L (ref 98–111)
Creatinine, Ser: 0.8 mg/dL (ref 0.44–1.00)
GFR, Estimated: >60 mL/min (ref >60)
Glucose, Bld: 131 mg/dL — ABNORMAL HIGH (ref 70–99)
Potassium: 3.7 mmol/L (ref 3.5–5.1)
Sodium: 138 mmol/L (ref 135–145)
Total Bilirubin: 0.7 mg/dL (ref 0.3–1.2)
Total Protein: 7.7 g/dL (ref 6.5–8.1)

## 2023-02-08 LAB — LIPASE, BLOOD: Lipase: 33 U/L (ref 11–51)

## 2023-02-08 MED ORDER — IOHEXOL 300 MG/ML  SOLN
100.0000 mL | Freq: Once | INTRAMUSCULAR | Status: AC | PRN
  Administered 2023-02-08: 100 mL via INTRAVENOUS

## 2023-02-08 MED ORDER — ONDANSETRON HCL 4 MG/2ML IJ SOLN
4.0000 mg | Freq: Once | INTRAMUSCULAR | Status: AC
  Administered 2023-02-08: 4 mg via INTRAVENOUS
  Filled 2023-02-08: qty 2

## 2023-02-08 MED ORDER — ONDANSETRON HCL 4 MG/2ML IJ SOLN: 4.0000 mg | Freq: Four times a day (QID) | INTRAMUSCULAR | Status: DC | PRN

## 2023-02-08 MED ORDER — ACETAMINOPHEN 325 MG PO TABS: 650.0000 mg | ORAL_TABLET | Freq: Four times a day (QID) | ORAL | Status: DC | PRN

## 2023-02-08 MED ORDER — PIPERACILLIN-TAZOBACTAM 3.375 G IVPB 30 MIN
3.3750 g | Freq: Once | INTRAVENOUS | Status: AC
  Administered 2023-02-08: 3.375 g via INTRAVENOUS
  Filled 2023-02-08: qty 50

## 2023-02-08 MED ORDER — MORPHINE SULFATE (PF) 2 MG/ML IV SOLN: 2.0000 mg | INTRAVENOUS | Status: DC | PRN

## 2023-02-08 MED ORDER — LACTATED RINGERS IV SOLN: INTRAVENOUS | Status: AC

## 2023-02-08 MED ORDER — LIP MEDEX EX OINT
TOPICAL_OINTMENT | CUTANEOUS | Status: DC | PRN
  Administered 2023-02-08: 75 via TOPICAL
  Filled 2023-02-08: qty 7

## 2023-02-08 MED ORDER — ENOXAPARIN SODIUM 40 MG/0.4ML IJ SOSY
40.0000 mg | PREFILLED_SYRINGE | INTRAMUSCULAR | Status: DC
  Administered 2023-02-08 – 2023-02-09 (×2): 40 mg via SUBCUTANEOUS
  Filled 2023-02-08 (×2): qty 0.4

## 2023-02-08 MED ORDER — PIPERACILLIN-TAZOBACTAM 3.375 G IVPB
3.3750 g | Freq: Three times a day (TID) | INTRAVENOUS | Status: DC
  Administered 2023-02-08 – 2023-02-10 (×7): 3.375 g via INTRAVENOUS
  Filled 2023-02-08 (×7): qty 50

## 2023-02-08 MED ORDER — FENTANYL CITRATE PF 50 MCG/ML IJ SOSY
50.0000 ug | PREFILLED_SYRINGE | Freq: Once | INTRAMUSCULAR | Status: AC
  Administered 2023-02-08: 50 ug via INTRAVENOUS
  Filled 2023-02-08: qty 1

## 2023-02-08 MED ORDER — PANTOPRAZOLE SODIUM 40 MG IV SOLR
40.0000 mg | INTRAVENOUS | Status: DC
  Administered 2023-02-08 – 2023-02-09 (×2): 40 mg via INTRAVENOUS
  Filled 2023-02-08 (×3): qty 10

## 2023-02-08 MED ORDER — OXYCODONE HCL 5 MG PO TABS: 5.0000 mg | ORAL_TABLET | Freq: Four times a day (QID) | ORAL | Status: DC | PRN

## 2023-02-08 MED ORDER — MORPHINE SULFATE (PF) 4 MG/ML IV SOLN
4.0000 mg | Freq: Once | INTRAVENOUS | Status: AC
  Administered 2023-02-08: 4 mg via INTRAVENOUS
  Filled 2023-02-08: qty 1

## 2023-02-08 MED ORDER — ACETAMINOPHEN 650 MG RE SUPP: 650.0000 mg | Freq: Four times a day (QID) | RECTAL | Status: DC | PRN

## 2023-02-08 NOTE — Consult Note (Signed)
Reason for Consult: Colonic abscess, diverticulitis Referring Physician: Dr. Ross Ludwig Folmsbee is an 67 y.o. female.  HPI: Patient is a 67 year old female who speaks Montagn[place] who comes in with her son to translate.  Patient has had pain for approximate 1 week.  Patient states that the pain began mainly in the left upper quadrant and then transition to a general abdominal pain.  She currently has generalized abdominal pain.  Patient had some nausea however no emesis at home.  No diarrhea at home.  Patient was seen in the ER and evaluated.  Patient went CT scan laboratory studies.  CT scan was significant for right-sided diverticulitis and small 1/2 x 1-1/2 cm abscess.  Patient also with leukocytosis.  I did review the patient's CT scan laboratory studies personally.  According to the patient's son she has not had a previous colonoscopy in the past.  General surgery was consulted for further evaluation.  Patient started on antibiotics.  Past Medical History:  Diagnosis Date   H/O oral aphthous ulcers 2018   Migraine     No past surgical history on file.  No family history on file.  Social History:  reports that she has never smoked. She has never used smokeless tobacco. She reports that she does not drink alcohol and does not use drugs.  Allergies: No Known Allergies  Medications: I have reviewed the patient's current medications.  Results for orders placed or performed during the hospital encounter of 02/08/23 (from the past 48 hour(s))  CBC with Differential     Status: Abnormal   Collection Time: 02/08/23  1:14 AM  Result Value Ref Range   WBC 12.0 (H) 4.0 - 10.5 K/uL   RBC 4.75 3.87 - 5.11 MIL/uL   Hemoglobin 14.2 12.0 - 15.0 g/dL   HCT 57.8 46.9 - 62.9 %   MCV 93.1 80.0 - 100.0 fL   MCH 29.9 26.0 - 34.0 pg   MCHC 32.1 30.0 - 36.0 g/dL   RDW 52.8 41.3 - 24.4 %   Platelets 177 150 - 400 K/uL   nRBC 0.0 0.0 - 0.2 %   Neutrophils Relative % 73 %   Neutro Abs 8.7 (H) 1.7 - 7.7  K/uL   Lymphocytes Relative 15 %   Lymphs Abs 1.8 0.7 - 4.0 K/uL   Monocytes Relative 10 %   Monocytes Absolute 1.2 (H) 0.1 - 1.0 K/uL   Eosinophils Relative 2 %   Eosinophils Absolute 0.2 0.0 - 0.5 K/uL   Basophils Relative 0 %   Basophils Absolute 0.1 0.0 - 0.1 K/uL   Immature Granulocytes 0 %   Abs Immature Granulocytes 0.03 0.00 - 0.07 K/uL    Comment: Performed at [facility], [address], [place], [place] [address]  Comprehensive metabolic panel     Status: Abnormal   Collection Time: 02/08/23  1:14 AM  Result Value Ref Range   Sodium 138 135 - 145 mmol/L   Potassium 3.7 3.5 - 5.1 mmol/L   Chloride 103 98 - 111 mmol/L   CO2 25 22 - 32 mmol/L   Glucose, Bld 131 (H) 70 - 99 mg/dL    Comment: Glucose reference range applies only to samples taken after fasting for at least 8 hours.   BUN 10 8 - 23 mg/dL   Creatinine, Ser 2.53 0.44 - 1.00 mg/dL   Calcium 8.7 (L) 8.9 - 10.3 mg/dL   Total Protein 7.7 6.5 - 8.1 g/dL   Albumin 4.1 3.5 - 5.0  g/dL   AST 23 15 - 41 U/L   ALT 21 0 - 44 U/L   Alkaline Phosphatase 67 38 - 126 U/L   Total Bilirubin 0.7 0.3 - 1.2 mg/dL   GFR, Estimated >16 >10 mL/min    Comment: (NOTE) Calculated using the CKD-EPI Creatinine Equation (2021)    Anion gap 10 5 - 15    Comment: Performed at [facility], [address], [place], [place] [address]  Lipase, blood     Status: None   Collection Time: 02/08/23  1:14 AM  Result Value Ref Range   Lipase 33 11 - 51 U/L    Comment: Performed at [facility], [address] [place], [place] [address]  Urinalysis, Routine w reflex microscopic -Urine, Clean Catch     Status: Abnormal   Collection Time: 02/08/23  2:20 AM  Result Value Ref Range   Color, Urine STRAW (A) YELLOW   APPearance CLEAR CLEAR   Specific Gravity, Urine 1.008 1.005 - 1.030   pH 8.0 5.0 - 8.0   Glucose, UA NEGATIVE NEGATIVE mg/dL   Hgb urine dipstick NEGATIVE NEGATIVE    Bilirubin Urine NEGATIVE NEGATIVE   Ketones, ur NEGATIVE NEGATIVE mg/dL   Protein, ur NEGATIVE NEGATIVE mg/dL   Nitrite NEGATIVE NEGATIVE   Leukocytes,Ua MODERATE (A) NEGATIVE   RBC / HPF 0-5 0 - 5 RBC/hpf   WBC, UA 11-20 0 - 5 WBC/hpf   Bacteria, UA NONE SEEN NONE SEEN   Squamous Epithelial / HPF 0-5 0 - 5 /HPF    Comment: Performed at [facility], [address] [place], [place] [address]    CT ABDOMEN PELVIS W CONTRAST  Result Date: 02/08/2023 CLINICAL DATA:  Abdominal pain. EXAM: CT ABDOMEN AND PELVIS WITH CONTRAST TECHNIQUE: Multidetector CT imaging of the abdomen and pelvis was performed using the standard protocol following bolus administration of intravenous contrast. RADIATION DOSE REDUCTION: This exam was performed according to the departmental dose-optimization program which includes automated exposure control, adjustment of the mA and/or kV according to patient size and/or use of iterative reconstruction technique. CONTRAST:  OMNIPAQUE IOHEXOL 300 MG/ML  SOLN COMPARISON:  CT with IV and oral contrast 08/05/2006 FINDINGS: Lower chest: Lung bases show scattered linear scarring or atelectasis mildly elevated right hemidiaphragm. No infiltrate is seen. There is bronchial thickening in the lower lobes. The cardiac size is normal. Hepatobiliary: The liver is 18 cm length with mild general steatosis. Gallbladder bile ducts are unremarkable. Pancreas: No abnormality Spleen: No abnormality. Adrenals/Urinary Tract: There are few bilateral subcentimeter renal cortical hypodensities which are too small to characterize. No follow-up imaging is recommended [facility] 2018 Feb; 264-273, Management of the Incidental Renal Mass on CT, [employer] 2021; 814-848, Bosniak Classification of Cystic Renal Masses, Version 2019). There is no adrenal mass or renal mass enhancement. There is no urinary stone or obstruction. There is a normal bladder thickness. Stomach/Bowel: There are  thickened folds in the stomach and in descending duodenum. Remainder of the small bowel is unremarkable. The appendix is normal. There are scattered colonic diverticula. There is a 5 cm segment of the proximal to mid ascending colon with circumferential wall thickening, moderate adjacent stranding greater medially, findings most likely due to ascending diverticulitis. There is an intramural rim enhancing abscess in the medial wall of the diseased segment measuring 1.5 x 1.5 x 1.4 cm (axial series 2, image 47, coronal reconstruction image 68). There is no extramural abscess or free air. Rest of  the colon wall is normal in thickness. Follow-up colonoscopy is recommended after treatment to exclude underlying lesion. Vascular/Lymphatic: There are trace aortic atherosclerotic plaques and mild calcifications in the iliac arteries. There is left-greater-than-right pelvic venous congestion. There are no enlarged lymph nodes. Reproductive: Uterus and bilateral adnexa are unremarkable. Other: There is trace reactive nonlocalizing fluid in the right pericolic gutter posterior to the diseased inflamed segment of the ascending colon. There is no further free fluid. There is no free air, hemorrhage or abscess. Musculoskeletal: There is osteopenia with degenerative changes of the thoracic and lumbar spine. IMPRESSION: 1. 5 cm segment of proximal to mid ascending colon with circumferential wall thickening, moderate adjacent stranding, and a 1.5 x 1.5 x 1.4 cm intramural abscess. Findings most likely due to ascending diverticulitis. Colonoscopy is recommended after treatment to exclude underlying lesion. 2. Trace reactive fluid in the right paracolic gutter posterior to the diseased segment. No free air or extramural abscess. 3. Gastroenteritis without evidence of small-bowel obstruction or inflammation. 4. Mildly prominent liver with mild steatosis. 5. Aortoiliac atherosclerosis. 6. Left-greater-than-right pelvic venous congestion.  7. Lower lobe bronchitis. 8. Osteopenia and degenerative change. Electronically Signed   By: Almira Bar M.D.   On: 02/08/2023 03:21    Review of Systems  Constitutional:  Negative for appetite change, chills and fever.  HENT:  Negative for ear discharge, hearing loss and sore throat.   Eyes:  Negative for discharge.  Respiratory:  Negative for cough and shortness of breath.   Cardiovascular:  Negative for chest pain and leg swelling.  Gastrointestinal:  Positive for abdominal pain, nausea and vomiting. Negative for constipation and diarrhea.  Musculoskeletal:  Negative for myalgias and neck pain.  Skin:  Negative for rash.  Allergic/Immunologic: Negative for environmental allergies.  Neurological:  Negative for dizziness and seizures.  Hematological:  Does not bruise/bleed easily.  Psychiatric/Behavioral:  Negative for suicidal ideas.   All other systems reviewed and are negative.  Blood pressure 98/70, pulse 72, temperature 98.4 F (36.9 C), temperature source Oral, resp. rate 16, height 4' 7.5" (1.41 m), weight 59.4 kg, SpO2 97 %. Physical Exam Constitutional:      Appearance: She is well-developed.     Comments: Conversant No acute distress  HENT:     Head: Normocephalic and atraumatic.  Eyes:     General: Lids are normal. No scleral icterus.    Pupils: Pupils are equal, round, and reactive to light.     Comments: Pupils are equal round and reactive No lid lag Moist conjunctiva  Neck:     Thyroid: No thyromegaly.     Trachea: No tracheal tenderness.     Comments: No cervical lymphadenopathy Cardiovascular:     Rate and Rhythm: Normal rate and regular rhythm.     Heart sounds: No murmur heard. Pulmonary:     Effort: Pulmonary effort is normal.     Breath sounds: Normal breath sounds. No wheezing or rales.  Abdominal:     Palpations: Abdomen is soft.     Tenderness: There is generalized abdominal tenderness. There is no guarding or rebound.     Hernia: No hernia is  present.  Musculoskeletal:     Cervical back: Normal range of motion and neck supple.  Skin:    General: Skin is warm.     Findings: No rash.     Nails: There is no clubbing.     Comments: Normal skin turgor  Neurological:     Mental Status: She is alert and  oriented to person, place, and time.     Comments: Normal gait and station  Psychiatric:        Mood and Affect: Mood normal.        Thought Content: Thought content normal.        Judgment: Judgment normal.     Comments: Appropriate affect     Assessment/Plan: 67 year old female with diverticulitis, small contained abscess to the right colon. 1.  Agree with antibiotics. 2.  Recommend n.p.o., IV fluids for bowel rest. 3.  Discussed with the patient's son that at this point would not recommend any percutaneous drain.  I feel antibiotics likely would resolve the small abscess.  If pain or clinical picture worsens she may be candidate for percutaneous IR drain. 4.  Will continue to follow along.  Moderate MDM  Axel Filler 02/08/2023, 7:19 AM

## 2023-02-08 NOTE — ED Triage Notes (Addendum)
Patient coming to ED for evaluation of abdominal pain.  Reports pain started around 4 weeks ago.  Pain became more severe tonight.  No reports of fever.  Has had nausea and vomiting.  States pain started in LUQ and now radiates throughout abdomen

## 2023-02-08 NOTE — ED Notes (Signed)
ED TO INPATIENT HANDOFF REPORT  ED Nurse Name and Phone #:   S Name/Age/Gender Karen Lee 67 y.o. female Room/Bed: WA12/WA12  Code Status   Code Status: Not on file  Home/SNF/Other Home Patient oriented to: self, place, time, and situation Is this baseline? Yes   Triage Complete: Triage complete  Chief Complaint Acute diverticulitis [K57.92]  Triage Note Patient coming to ED for evaluation of abdominal pain.  Reports pain started around 4 weeks ago.  Pain became more severe tonight.  No reports of fever.  Has had nausea and vomiting.  States pain started in LUQ and now radiates throughout abdomen   Allergies No Known Allergies  Level of Care/Admitting Diagnosis ED Disposition     ED Disposition  Admit   Condition  --   Comment  Hospital Area: [facility] [[id number]]  Level of Care: Med-Surg [16]  May admit patient to [facility] or [facility] if equivalent level of care is available:: No  Covid Evaluation: Asymptomatic - no recent exposure (last 10 days) testing not required  Diagnosis: Acute diverticulitis [[id number]]  Admitting Physician: Bobette Mo [[id number]]  Attending Physician: Bobette Mo [[id number]]  Certification:: I certify this patient will need inpatient services for at least 2 midnights  Estimated Length of Stay: 2          B Medical/Surgery History Past Medical History:  Diagnosis Date   H/O oral aphthous ulcers 2018   Migraine    No past surgical history on file.   A IV Location/Drains/Wounds Patient Lines/Drains/Airways Status     Active Line/Drains/Airways     Name Placement date Placement time Site Days   Peripheral IV 02/08/23 20 G Left Antecubital 02/08/23  0117  Antecubital  less than 1            Intake/Output Last 24 hours  Intake/Output Summary (Last 24 hours) at 02/08/2023 0757 Last data filed at 02/08/2023 0409 Gross per 24 hour  Intake 50 ml  Output --  Net 50 ml     Labs/Imaging Results for orders placed or performed during the hospital encounter of 02/08/23 (from the past 48 hour(s))  CBC with Differential     Status: Abnormal   Collection Time: 02/08/23  1:14 AM  Result Value Ref Range   WBC 12.0 (H) 4.0 - 10.5 K/uL   RBC 4.75 3.87 - 5.11 MIL/uL   Hemoglobin 14.2 12.0 - 15.0 g/dL   HCT 32.4 40.1 - 02.7 %   MCV 93.1 80.0 - 100.0 fL   MCH 29.9 26.0 - 34.0 pg   MCHC 32.1 30.0 - 36.0 g/dL   RDW 25.3 66.4 - 40.3 %   Platelets 177 150 - 400 K/uL   nRBC 0.0 0.0 - 0.2 %   Neutrophils Relative % 73 %   Neutro Abs 8.7 (H) 1.7 - 7.7 K/uL   Lymphocytes Relative 15 %   Lymphs Abs 1.8 0.7 - 4.0 K/uL   Monocytes Relative 10 %   Monocytes Absolute 1.2 (H) 0.1 - 1.0 K/uL   Eosinophils Relative 2 %   Eosinophils Absolute 0.2 0.0 - 0.5 K/uL   Basophils Relative 0 %   Basophils Absolute 0.1 0.0 - 0.1 K/uL   Immature Granulocytes 0 %   Abs Immature Granulocytes 0.03 0.00 - 0.07 K/uL    Comment: Performed at [facility], [address], [place], [place] [address]  Comprehensive metabolic panel     Status: Abnormal   Collection Time: 02/08/23  1:14 AM  Result Value Ref Range   Sodium 138 135 - 145 mmol/L   Potassium 3.7 3.5 - 5.1 mmol/L   Chloride 103 98 - 111 mmol/L   CO2 25 22 - 32 mmol/L   Glucose, Bld 131 (H) 70 - 99 mg/dL    Comment: Glucose reference range applies only to samples taken after fasting for at least 8 hours.   BUN 10 8 - 23 mg/dL   Creatinine, Ser 1.19 0.44 - 1.00 mg/dL   Calcium 8.7 (L) 8.9 - 10.3 mg/dL   Total Protein 7.7 6.5 - 8.1 g/dL   Albumin 4.1 3.5 - 5.0 g/dL   AST 23 15 - 41 U/L   ALT 21 0 - 44 U/L   Alkaline Phosphatase 67 38 - 126 U/L   Total Bilirubin 0.7 0.3 - 1.2 mg/dL   GFR, Estimated >14 >78 mL/min    Comment: (NOTE) Calculated using the CKD-EPI Creatinine Equation (2021)    Anion gap 10 5 - 15    Comment: Performed at [facility], [address] [place], [place]  [address]  Lipase, blood     Status: None   Collection Time: 02/08/23  1:14 AM  Result Value Ref Range   Lipase 33 11 - 51 U/L    Comment: Performed at [facility], [address] [place], [place] [address]  Urinalysis, Routine w reflex microscopic -Urine, Clean Catch     Status: Abnormal   Collection Time: 02/08/23  2:20 AM  Result Value Ref Range   Color, Urine STRAW (A) YELLOW   APPearance CLEAR CLEAR   Specific Gravity, Urine 1.008 1.005 - 1.030   pH 8.0 5.0 - 8.0   Glucose, UA NEGATIVE NEGATIVE mg/dL   Hgb urine dipstick NEGATIVE NEGATIVE   Bilirubin Urine NEGATIVE NEGATIVE   Ketones, ur NEGATIVE NEGATIVE mg/dL   Protein, ur NEGATIVE NEGATIVE mg/dL   Nitrite NEGATIVE NEGATIVE   Leukocytes,Ua MODERATE (A) NEGATIVE   RBC / HPF 0-5 0 - 5 RBC/hpf   WBC, UA 11-20 0 - 5 WBC/hpf   Bacteria, UA NONE SEEN NONE SEEN   Squamous Epithelial / HPF 0-5 0 - 5 /HPF    Comment: Performed at [facility], [address], [place], [place] [address]   CT ABDOMEN PELVIS W CONTRAST  Result Date: 02/08/2023 CLINICAL DATA:  Abdominal pain. EXAM: CT ABDOMEN AND PELVIS WITH CONTRAST TECHNIQUE: Multidetector CT imaging of the abdomen and pelvis was performed using the standard protocol following bolus administration of intravenous contrast. RADIATION DOSE REDUCTION: This exam was performed according to the departmental dose-optimization program which includes automated exposure control, adjustment of the mA and/or kV according to patient size and/or use of iterative reconstruction technique. CONTRAST:  OMNIPAQUE IOHEXOL 300 MG/ML  SOLN COMPARISON:  CT with IV and oral contrast 08/05/2006 FINDINGS: Lower chest: Lung bases show scattered linear scarring or atelectasis mildly elevated right hemidiaphragm. No infiltrate is seen. There is bronchial thickening in the lower lobes. The cardiac size is normal. Hepatobiliary: The liver is 18 cm length with mild general steatosis.  Gallbladder bile ducts are unremarkable. Pancreas: No abnormality Spleen: No abnormality. Adrenals/Urinary Tract: There are few bilateral subcentimeter renal cortical hypodensities which are too small to characterize. No follow-up imaging is recommended [facility] 2018 Feb; 264-273, Management of the Incidental Renal Mass on CT, [employer] 2021; 814-848, Bosniak Classification of Cystic Renal Masses, Version 2019). There is no adrenal mass or renal mass enhancement. There is  no urinary stone or obstruction. There is a normal bladder thickness. Stomach/Bowel: There are thickened folds in the stomach and in descending duodenum. Remainder of the small bowel is unremarkable. The appendix is normal. There are scattered colonic diverticula. There is a 5 cm segment of the proximal to mid ascending colon with circumferential wall thickening, moderate adjacent stranding greater medially, findings most likely due to ascending diverticulitis. There is an intramural rim enhancing abscess in the medial wall of the diseased segment measuring 1.5 x 1.5 x 1.4 cm (axial series 2, image 47, coronal reconstruction image 68). There is no extramural abscess or free air. Rest of the colon wall is normal in thickness. Follow-up colonoscopy is recommended after treatment to exclude underlying lesion. Vascular/Lymphatic: There are trace aortic atherosclerotic plaques and mild calcifications in the iliac arteries. There is left-greater-than-right pelvic venous congestion. There are no enlarged lymph nodes. Reproductive: Uterus and bilateral adnexa are unremarkable. Other: There is trace reactive nonlocalizing fluid in the right pericolic gutter posterior to the diseased inflamed segment of the ascending colon. There is no further free fluid. There is no free air, hemorrhage or abscess. Musculoskeletal: There is osteopenia with degenerative changes of the thoracic and lumbar spine. IMPRESSION: 1. 5 cm segment of proximal to mid ascending colon  with circumferential wall thickening, moderate adjacent stranding, and a 1.5 x 1.5 x 1.4 cm intramural abscess. Findings most likely due to ascending diverticulitis. Colonoscopy is recommended after treatment to exclude underlying lesion. 2. Trace reactive fluid in the right paracolic gutter posterior to the diseased segment. No free air or extramural abscess. 3. Gastroenteritis without evidence of small-bowel obstruction or inflammation. 4. Mildly prominent liver with mild steatosis. 5. Aortoiliac atherosclerosis. 6. Left-greater-than-right pelvic venous congestion. 7. Lower lobe bronchitis. 8. Osteopenia and degenerative change. Electronically Signed   By: Almira Bar M.D.   On: 02/08/2023 03:21    Pending Labs Unresulted Labs (From admission, onward)    None       Vitals/Pain Today's Vitals   02/08/23 0430 02/08/23 0516 02/08/23 0630 02/08/23 0748  BP: 132/70  98/70   Pulse: 60  72   Resp: 18  16   Temp:  98.4 F (36.9 C)  98.4 F (36.9 C)  TempSrc:  Oral  Oral  SpO2: 98%  97%   Weight:      Height:      PainSc:        Isolation Precautions No active isolations  Medications Medications  morphine (PF) 2 MG/ML injection 2 mg (has no administration in time range)  ondansetron (ZOFRAN) injection 4 mg (has no administration in time range)  lactated ringers infusion ( Intravenous New Bag/Given 02/08/23 0631)  acetaminophen (TYLENOL) tablet 650 mg (has no administration in time range)  oxyCODONE (Oxy IR/ROXICODONE) immediate release tablet 5 mg (has no administration in time range)  piperacillin-tazobactam (ZOSYN) IVPB 3.375 g (has no administration in time range)  fentaNYL (SUBLIMAZE) injection 50 mcg (50 mcg Intravenous Given 02/08/23 0120)  ondansetron (ZOFRAN) injection 4 mg (4 mg Intravenous Given 02/08/23 0120)  iohexol (OMNIPAQUE) 300 MG/ML solution 100 mL (100 mLs Intravenous Contrast Given 02/08/23 0249)  piperacillin-tazobactam (ZOSYN) IVPB 3.375 g (0 g Intravenous  Stopped 02/08/23 0409)  morphine (PF) 4 MG/ML injection 4 mg (4 mg Intravenous Given 02/08/23 1610)    Mobility walks     Focused Assessments    R Recommendations: See Admitting Provider Note  Report given to:   Additional Notes:

## 2023-02-08 NOTE — Progress Notes (Signed)
Pharmacy Antibiotic Note  Karen Lee is a 67 y.o. female admitted on 02/08/2023 with history of elevated LFTs, presenting to the ED with abdominal pain .  Pharmacy has been consulted for zosyn dosing.  Plan: Zosyn 3.375g IV Q8H infused over 4hrs. Pharmacy will sign off and follow peripherally  Height: 4' 7.5" (141 cm) Weight: 59.4 kg (131 lb) IBW/kg (Calculated) : 35.15  Temp (24hrs), Avg:98.1 F (36.7 C), Min:98.1 F (36.7 C), Max:98.1 F (36.7 C)  Recent Labs  Lab 02/08/23 0114  WBC 12.0*  CREATININE 0.80    Estimated Creatinine Clearance: 48.4 mL/min (by C-G formula based on SCr of 0.8 mg/dL).    No Known Allergies   Thank you for allowing pharmacy to be a part of this patient's care.  Arley Phenix RPh 02/08/2023, 4:20 AM

## 2023-02-08 NOTE — H&P (Signed)
History and Physical    Patient: Karen Lee WUJ:[id number] DOB: May 21, 1956 DOA: 02/08/2023 DOS: the patient was seen and examined on 02/08/2023 PCP: Julieanne Manson, MD  Patient coming from: Home  Chief Complaint:  Chief Complaint  Patient presents with   Abdominal Pain   HPI: Karen Lee is a 67 y.o. female with medical history significant of oral aphthous ulcers, migraine who presented to the emergency department with progressively worse abdominal pain for the last week that particularly became more severe last night associated with several episodes of nausea and emesis.   No, diarrhea, constipation, melena or hematochezia.  No flank pain, dysuria, frequency or hematuria.No fever, chills or night sweats. No sore throat, rhinorrhea, dyspnea, wheezing or hemoptysis.  No chest pain, palpitations, diaphoresis, PND, orthopnea or pitting edema of the lower extremities.   No polyuria, polydipsia, polyphagia or blurred vision.  ED course: Initial vital signs were temperature 98.1 F, pulse 83, respiration 18, BP 162/90 mmHg O2 sat 94% on room air.  The patient received fentanyl 50 mcg IVP, morphine 4 mg IVP, ondansetron 4 mg IVP and was started on Zosyn every 8 hours.  Lab work: Her urinalysis showed moderate leukocyte esterase.  CBC showed a white count of 12.0 with 73% neutrophils, hemoglobin 14.2 g/dL platelets 956.  Lipase was normal.  CMP with a glucose of 137 and calcium 8.7 mg/deciliter.  The rest of the CMP measurements were normal.  Imaging: CT abdomen/pelvis with contrast showing a 5 cm segment of proximal to mid ascending: With circumferential wall thickening, moderate adjacent stranding, and a 1.5 x 1.5 x 1.4 cm intramural abscess.  Findings most likely due to ascending diverticulitis.  There is trace reactive fluid in the right paracolic gutter posterior to the disease segment.  No free air or extramural abscess.  Gastroenteritis without evidence of small bowel obstruction or inflammation.   Mildly prominent liver with mild asteatosis.  Aortoiliac atherosclerosis.  Left greater than right pelvic venous congestion.  Lower lobe bronchitis.  Osteopenia.   Review of Systems: As mentioned in the history of present illness. All other systems reviewed and are negative.  Past Medical History:  Diagnosis Date   H/O oral aphthous ulcers 2018   Migraine    No past surgical history on file. Social History:  reports that she has never smoked. She has never used smokeless tobacco. She reports that she does not drink alcohol and does not use drugs.  No Known Allergies  No family history on file.  Prior to Admission medications   Medication Sig Start Date End Date Taking? Authorizing Provider  LORazepam (ATIVAN) 1 MG tablet Take 1 tablet (1 mg total) by mouth 2 (two) times daily as needed for anxiety. Patient not taking: Reported on 02/08/2023 12/08/18   Charlynne Pander, MD  omeprazole (PRILOSEC) 20 MG capsule Take 1 capsule (20 mg total) by mouth daily. Patient not taking: Reported on 02/08/2023 12/16/18   Roxy Horseman, PA-C    Physical Exam: Vitals:   02/08/23 0430 02/08/23 0516 02/08/23 0630 02/08/23 0748  BP: 132/70  98/70   Pulse: 60  72   Resp: 18  16   Temp:  98.4 F (36.9 C)  98.4 F (36.9 C)  TempSrc:  Oral  Oral  SpO2: 98%  97%   Weight:      Height:       Physical Exam Vitals and nursing note reviewed.  Constitutional:      General: She is awake. She is not in acute  distress.    Appearance: She is well-developed and overweight.  HENT:     Head: Normocephalic.     Nose: No rhinorrhea.     Mouth/Throat:     Mouth: Mucous membranes are dry.  Eyes:     General: No scleral icterus.    Pupils: Pupils are equal, round, and reactive to light.  Neck:     Vascular: No JVD.  Cardiovascular:     Rate and Rhythm: Normal rate and regular rhythm.     Heart sounds: S1 normal and S2 normal.  Pulmonary:     Effort: Pulmonary effort is normal.     Breath sounds:  Normal breath sounds.  Abdominal:     Palpations: Abdomen is soft.     Tenderness: There is generalized abdominal tenderness. There is no right CVA tenderness, left CVA tenderness, guarding or rebound.  Musculoskeletal:     Cervical back: Neck supple.     Right lower leg: No edema.     Left lower leg: No edema.  Skin:    General: Skin is warm and dry.  Neurological:     General: No focal deficit present.     Mental Status: She is alert and oriented to person, place, and time.  Psychiatric:        Mood and Affect: Mood normal.        Behavior: Behavior normal. Behavior is cooperative.     Data Reviewed:  Results are pending, will review when available.  Assessment and Plan: Principal Problem:   Acute diverticulitis with intramural abscess MedSurg/inpatient. Keep n.p.o. for now. Analgesics as needed. Antiemetics as needed. Pantoprazole 40 mg IVP every 24 hours. Continue Zosyn 3.375 g IVPB every 8 hours. Follow-up CBC and CMP in AM.  Active Problems:   Hyperglycemia Check fasting glucose in AM.    Hypocalcemia Repeat calcium level in the morning. Further workup depending on results.      Advance Care Planning:   Code Status: Full Code   Consults: General surgery Axel Filler, MD).   Family Communication: Her spouse and daughter were at bedside.  Severity of Illness: The appropriate patient status for this patient is INPATIENT. Inpatient status is judged to be reasonable and necessary in order to provide the required intensity of service to ensure the patient's safety. The patient's presenting symptoms, physical exam findings, and initial radiographic and laboratory data in the context of their chronic comorbidities is felt to place them at high risk for further clinical deterioration. Furthermore, it is not anticipated that the patient will be medically stable for discharge from the hospital within 2 midnights of admission.   * I certify that at the point of  admission it is my clinical judgment that the patient will require inpatient hospital care spanning beyond 2 midnights from the point of admission due to high intensity of service, high risk for further deterioration and high frequency of surveillance required.*  Author: Bobette Mo, MD 02/08/2023 7:59 AM  For on call review [email or web address].   This document was prepared using Dragon voice recognition software and may contain some unintended transcription errors.

## 2023-02-08 NOTE — Progress Notes (Deleted)
  Transition of Care [facility]) Screening Note   Patient Details  Name: Karen Lee Date of Birth: 1956/06/01   Transition of Care [facility]) CM/SW Contact:    Adrian Prows, RN Phone Number: 02/08/2023, 1:58 PM    Transition of Care Department [facility]) has reviewed patient and no TOC needs have been identified at this time. We will continue to monitor patient advancement through interdisciplinary progression rounds. If new patient transition needs arise, please place a TOC consult.

## 2023-02-08 NOTE — ED Provider Notes (Signed)
[place] EMERGENCY DEPARTMENT AT [facility] Provider Note   CSN: [id number] Arrival date & time: 02/08/23  0032     History  Chief Complaint  Patient presents with   Abdominal Pain    Karen Lee is a 67 y.o. female.  The history is provided by the patient, a relative and medical records. The history is limited by a language barrier. A language interpreter was used (son at bedside interpreting).  Abdominal Pain Associated symptoms: nausea    67 year old female with history of elevated LFTs, presenting to the ED with abdominal pain.  Pain has been ongoing for about a week now.  Pain began in her left upper abdomen and is now radiating throughout.  Pain is constant, dull, and feels "hard".  She has had some nausea and vomiting.  She denies any fever.  No difficulty with urination or bowel movements.  She denies any history of prior surgeries.  She reports history of similar pain in the past but usually only lasted a day or so before resolving, this episode has been more prolonged and prominent.  She has not had any medications prior to arrival.  Home Medications Prior to Admission medications   Medication Sig Start Date End Date Taking? Authorizing Provider  LORazepam (ATIVAN) 1 MG tablet Take 1 tablet (1 mg total) by mouth 2 (two) times daily as needed for anxiety. Patient not taking: Reported on 02/08/2023 12/08/18   Charlynne Pander, MD  omeprazole (PRILOSEC) 20 MG capsule Take 1 capsule (20 mg total) by mouth daily. Patient not taking: Reported on 02/08/2023 12/16/18   Roxy Horseman, PA-C      Allergies    Patient has no known allergies.    Review of Systems   Review of Systems  Gastrointestinal:  Positive for abdominal pain and nausea.  All other systems reviewed and are negative.   Physical Exam Updated Vital Signs BP (!) 118/57   Pulse 64   Temp 98.1 F (36.7 C) (Oral)   Resp 16   Ht 4' 7.5" (1.41 m)   Wt 59.4 kg   SpO2 98%   BMI 29.90 kg/m    Physical Exam Vitals and nursing note reviewed.  Constitutional:      Appearance: She is well-developed.  HENT:     Head: Normocephalic and atraumatic.  Eyes:     Conjunctiva/sclera: Conjunctivae normal.     Pupils: Pupils are equal, round, and reactive to light.  Cardiovascular:     Rate and Rhythm: Normal rate and regular rhythm.     Heart sounds: Normal heart sounds.  Pulmonary:     Effort: Pulmonary effort is normal.     Breath sounds: Normal breath sounds.  Abdominal:     General: Bowel sounds are normal.     Palpations: Abdomen is soft.     Tenderness: There is generalized abdominal tenderness.  Musculoskeletal:        General: Normal range of motion.     Cervical back: Normal range of motion.  Skin:    General: Skin is warm and dry.  Neurological:     Mental Status: She is alert and oriented to person, place, and time.    ED Results / Procedures / Treatments   Labs (all labs ordered are listed, but only abnormal results are displayed) Labs Reviewed  CBC WITH DIFFERENTIAL/PLATELET - Abnormal; Notable for the following components:      Result Value   WBC 12.0 (*)    Neutro Abs 8.7 (*)  Monocytes Absolute 1.2 (*)    All other components within normal limits  COMPREHENSIVE METABOLIC PANEL - Abnormal; Notable for the following components:   Glucose, Bld 131 (*)    Calcium 8.7 (*)    All other components within normal limits  URINALYSIS, ROUTINE W REFLEX MICROSCOPIC - Abnormal; Notable for the following components:   Color, Urine STRAW (*)    Leukocytes,Ua MODERATE (*)    All other components within normal limits  LIPASE, BLOOD    EKG None  Radiology CT ABDOMEN PELVIS W CONTRAST  Result Date: 02/08/2023 CLINICAL DATA:  Abdominal pain. EXAM: CT ABDOMEN AND PELVIS WITH CONTRAST TECHNIQUE: Multidetector CT imaging of the abdomen and pelvis was performed using the standard protocol following bolus administration of intravenous contrast. RADIATION DOSE  REDUCTION: This exam was performed according to the departmental dose-optimization program which includes automated exposure control, adjustment of the mA and/or kV according to patient size and/or use of iterative reconstruction technique. CONTRAST:  OMNIPAQUE IOHEXOL 300 MG/ML  SOLN COMPARISON:  CT with IV and oral contrast 08/05/2006 FINDINGS: Lower chest: Lung bases show scattered linear scarring or atelectasis mildly elevated right hemidiaphragm. No infiltrate is seen. There is bronchial thickening in the lower lobes. The cardiac size is normal. Hepatobiliary: The liver is 18 cm length with mild general steatosis. Gallbladder bile ducts are unremarkable. Pancreas: No abnormality Spleen: No abnormality. Adrenals/Urinary Tract: There are few bilateral subcentimeter renal cortical hypodensities which are too small to characterize. No follow-up imaging is recommended [facility] 2018 Feb; 264-273, Management of the Incidental Renal Mass on CT, [employer] 2021; 814-848, Bosniak Classification of Cystic Renal Masses, Version 2019). There is no adrenal mass or renal mass enhancement. There is no urinary stone or obstruction. There is a normal bladder thickness. Stomach/Bowel: There are thickened folds in the stomach and in descending duodenum. Remainder of the small bowel is unremarkable. The appendix is normal. There are scattered colonic diverticula. There is a 5 cm segment of the proximal to mid ascending colon with circumferential wall thickening, moderate adjacent stranding greater medially, findings most likely due to ascending diverticulitis. There is an intramural rim enhancing abscess in the medial wall of the diseased segment measuring 1.5 x 1.5 x 1.4 cm (axial series 2, image 47, coronal reconstruction image 68). There is no extramural abscess or free air. Rest of the colon wall is normal in thickness. Follow-up colonoscopy is recommended after treatment to exclude underlying lesion. Vascular/Lymphatic:  There are trace aortic atherosclerotic plaques and mild calcifications in the iliac arteries. There is left-greater-than-right pelvic venous congestion. There are no enlarged lymph nodes. Reproductive: Uterus and bilateral adnexa are unremarkable. Other: There is trace reactive nonlocalizing fluid in the right pericolic gutter posterior to the diseased inflamed segment of the ascending colon. There is no further free fluid. There is no free air, hemorrhage or abscess. Musculoskeletal: There is osteopenia with degenerative changes of the thoracic and lumbar spine. IMPRESSION: 1. 5 cm segment of proximal to mid ascending colon with circumferential wall thickening, moderate adjacent stranding, and a 1.5 x 1.5 x 1.4 cm intramural abscess. Findings most likely due to ascending diverticulitis. Colonoscopy is recommended after treatment to exclude underlying lesion. 2. Trace reactive fluid in the right paracolic gutter posterior to the diseased segment. No free air or extramural abscess. 3. Gastroenteritis without evidence of small-bowel obstruction or inflammation. 4. Mildly prominent liver with mild steatosis. 5. Aortoiliac atherosclerosis. 6. Left-greater-than-right pelvic venous congestion. 7. Lower lobe bronchitis. 8. Osteopenia and degenerative change. Electronically  Signed   By: Almira Bar M.D.   On: 02/08/2023 03:21    Procedures Procedures    Medications Ordered in ED Medications  morphine (PF) 2 MG/ML injection 2 mg (has no administration in time range)  ondansetron (ZOFRAN) injection 4 mg (has no administration in time range)  lactated ringers infusion (has no administration in time range)  acetaminophen (TYLENOL) tablet 650 mg (has no administration in time range)  oxyCODONE (Oxy IR/ROXICODONE) immediate release tablet 5 mg (has no administration in time range)  fentaNYL (SUBLIMAZE) injection 50 mcg (50 mcg Intravenous Given 02/08/23 0120)  ondansetron (ZOFRAN) injection 4 mg (4 mg Intravenous  Given 02/08/23 0120)  iohexol (OMNIPAQUE) 300 MG/ML solution 100 mL (100 mLs Intravenous Contrast Given 02/08/23 0249)  piperacillin-tazobactam (ZOSYN) IVPB 3.375 g (3.375 g Intravenous New Bag/Given 02/08/23 0339)  morphine (PF) 4 MG/ML injection 4 mg (4 mg Intravenous Given 02/08/23 1610)    ED Course/ Medical Decision Making/ A&P                             Medical Decision Making Amount and/or Complexity of Data Reviewed Labs: ordered. Radiology: ordered and independent interpretation performed. ECG/medicine tests: ordered and independent interpretation performed.  Risk Prescription drug management. Decision regarding hospitalization.   67 year old female presenting to the ED with abdominal pain.  Pain localized to LUQ and radiates out to rest of abdomen.  Some nausea/vomiting reported.  No diarrhea.  Afebrile, non-toxic.  She does have tenderness, generalized at this point.  Will obtain labs, CT.  Fentanyl and zofran given for symptom control.  Labs as above-- WBC count 12K.  No electrolyte abnormalities.  Normal lipase.  UA without acute signs of infection.  CT with findings of diverticulitis with abscess formation.  No signs of perforation.  Will start IV zosyn.  She will require admission.  Discussed with hospitalist, Dr. Joneen Roach-- will admit for ongoing care.  Secure chat message sent to General surgery team for AM consultation to see if amenable to drain placement.  Final Clinical Impression(s) / ED Diagnoses Final diagnoses:  Diverticulitis of large intestine with abscess without bleeding    Rx / DC Orders ED Discharge Orders     None         Garlon Hatchet, PA-C 02/08/23 0416    Tilden Fossa, MD 02/08/23 [phone]

## 2023-02-09 LAB — CBC
HCT: 41.7 % (ref 36.0–46.0)
Hemoglobin: 13.4 g/dL (ref 12.0–15.0)
MCH: 30.1 pg (ref 26.0–34.0)
MCHC: 32.1 g/dL (ref 30.0–36.0)
MCV: 93.7 fL (ref 80.0–100.0)
Platelets: 159 10*3/uL (ref 150–400)
RBC: 4.45 MIL/uL (ref 3.87–5.11)
RDW: 12.1 % (ref 11.5–15.5)
WBC: 7.8 10*3/uL (ref 4.0–10.5)
nRBC: 0 % (ref 0.0–0.2)

## 2023-02-09 LAB — COMPREHENSIVE METABOLIC PANEL
ALT: 17 U/L (ref 0–44)
AST: 17 U/L (ref 15–41)
Albumin: 3.5 g/dL (ref 3.5–5.0)
Alkaline Phosphatase: 55 U/L (ref 38–126)
Anion gap: 8 (ref 5–15)
BUN: 12 mg/dL (ref 8–23)
CO2: 25 mmol/L (ref 22–32)
Calcium: 8.5 mg/dL — ABNORMAL LOW (ref 8.9–10.3)
Chloride: 102 mmol/L (ref 98–111)
Creatinine, Ser: 0.98 mg/dL (ref 0.44–1.00)
GFR, Estimated: >60 mL/min (ref >60)
Glucose, Bld: 100 mg/dL — ABNORMAL HIGH (ref 70–99)
Potassium: 4 mmol/L (ref 3.5–5.1)
Sodium: 135 mmol/L (ref 135–145)
Total Bilirubin: 1 mg/dL (ref 0.3–1.2)
Total Protein: 7.3 g/dL (ref 6.5–8.1)

## 2023-02-09 LAB — HIV ANTIBODY (ROUTINE TESTING W REFLEX): HIV Screen 4th Generation wRfx: NONREACTIVE

## 2023-02-09 MED ORDER — SODIUM CHLORIDE 0.9 % IV SOLN: INTRAVENOUS | Status: DC

## 2023-02-09 MED ORDER — LACTATED RINGERS IV SOLN: INTRAVENOUS | Status: DC

## 2023-02-09 NOTE — Progress Notes (Signed)
Mobility Specialist - Progress Note   02/09/23 0941  Mobility  Activity Ambulated with assistance in hallway  Level of Assistance Independent after set-up  Assistive Device Other (Comment) (IV Pole)  Distance Ambulated (ft) 265 ft  Activity Response Tolerated well  Mobility Referral Yes  $Mobility charge 1 Mobility  Mobility Specialist Start Time (ACUTE ONLY) 0934  Mobility Specialist Stop Time (ACUTE ONLY) 0941  Mobility Specialist Time Calculation (min) (ACUTE ONLY) 7 min   Pt received in bed and agreeable to mobility. No complaints during session. Pt to bed after session with all needs met.   [facility]

## 2023-02-09 NOTE — Progress Notes (Signed)
Mobility Specialist - Progress Note   02/09/23 1523  Mobility  Activity Ambulated with assistance in hallway  Level of Assistance Independent after set-up  Assistive Device Other (Comment) (IV Pole)  Distance Ambulated (ft) 265 ft  Activity Response Tolerated well  Mobility Referral Yes  $Mobility charge 1 Mobility   Pt received in bed and agreeable to mobility. No complaints during session. Pt to bed after session with all needs met.    [facility]

## 2023-02-09 NOTE — Progress Notes (Signed)
Progress Note     Subjective: She is feeling much better today. Pain significantly improved and now mild in right lower abdomen. No n/v. Passing flatus. No BM. Has been ambulating  Interpretor offered and declined - son at bedside serves as interpretor  Objective: Vital signs in last 24 hours: Temp:  [98.5 F (36.9 C)-99.2 F (37.3 C)] 98.5 F (36.9 C) (05/20 0537) Pulse Rate:  [64-75] 64 (05/20 0537) Resp:  [14-18] 18 (05/20 0537) BP: (110-127)/(74-82) 115/74 (05/20 0537) SpO2:  [96 %] 96 % (05/20 0537) Last BM Date :  (unknown)  Intake/Output from previous day: 05/19 0701 - 05/20 0700 In: 1783.1 [I.V.:1629.6; IV Piggyback:153.5] Out: 200 [Urine:200] Intake/Output this shift: No intake/output data recorded.  PE: General: pleasant, WD, female who is laying in bed in NAD Lungs: Respiratory effort nonlabored on room air Abd: soft, ND, no masses, hernias, or organomegaly. Minimal TTP in RLQ without rebound or guiding  MSK: all 4 extremities are symmetrical with no cyanosis, clubbing, or edema. Skin: warm and dry with no masses, lesions, or rashes Psych: A&Ox3 with an appropriate affect.    Lab Results:  Recent Labs    02/08/23 0114 02/09/23 0436  WBC 12.0* 7.8  HGB 14.2 13.4  HCT 44.2 41.7  PLT 177 159   BMET Recent Labs    02/08/23 0114 02/09/23 0436  NA 138 135  K 3.7 4.0  CL 103 102  CO2 25 25  GLUCOSE 131* 100*  BUN 10 12  CREATININE 0.80 0.98  CALCIUM 8.7* 8.5*   PT/INR No results for input(s): "LABPROT", "INR" in the last 72 hours. CMP     Component Value Date/Time   NA 135 02/09/2023 0436   NA 141 10/16/2017 1312   K 4.0 02/09/2023 0436   CL 102 02/09/2023 0436   CO2 25 02/09/2023 0436   GLUCOSE 100 (H) 02/09/2023 0436   BUN 12 02/09/2023 0436   BUN 15 10/16/2017 1312   CREATININE 0.98 02/09/2023 0436   CALCIUM 8.5 (L) 02/09/2023 0436   PROT 7.3 02/09/2023 0436   PROT 7.9 02/24/2018 1242   ALBUMIN 3.5 02/09/2023 0436   ALBUMIN 5.0  (H) 02/24/2018 1242   AST 17 02/09/2023 0436   ALT 17 02/09/2023 0436   ALKPHOS 55 02/09/2023 0436   BILITOT 1.0 02/09/2023 0436   BILITOT 0.4 02/24/2018 1242   GFRNONAA >60 02/09/2023 0436   GFRAA 57 (L) 12/16/2018 0122   Lipase     Component Value Date/Time   LIPASE 33 02/08/2023 0114       Studies/Results: CT ABDOMEN PELVIS W CONTRAST  Result Date: 02/08/2023 CLINICAL DATA:  Abdominal pain. EXAM: CT ABDOMEN AND PELVIS WITH CONTRAST TECHNIQUE: Multidetector CT imaging of the abdomen and pelvis was performed using the standard protocol following bolus administration of intravenous contrast. RADIATION DOSE REDUCTION: This exam was performed according to the departmental dose-optimization program which includes automated exposure control, adjustment of the mA and/or kV according to patient size and/or use of iterative reconstruction technique. CONTRAST:  OMNIPAQUE IOHEXOL 300 MG/ML  SOLN COMPARISON:  CT with IV and oral contrast 08/05/2006 FINDINGS: Lower chest: Lung bases show scattered linear scarring or atelectasis mildly elevated right hemidiaphragm. No infiltrate is seen. There is bronchial thickening in the lower lobes. The cardiac size is normal. Hepatobiliary: The liver is 18 cm length with mild general steatosis. Gallbladder bile ducts are unremarkable. Pancreas: No abnormality Spleen: No abnormality. Adrenals/Urinary Tract: There are few bilateral subcentimeter renal cortical hypodensities which  are too small to characterize. No follow-up imaging is recommended [facility] 2018 Feb; 264-273, Management of the Incidental Renal Mass on CT, [employer] 2021; 814-848, Bosniak Classification of Cystic Renal Masses, Version 2019). There is no adrenal mass or renal mass enhancement. There is no urinary stone or obstruction. There is a normal bladder thickness. Stomach/Bowel: There are thickened folds in the stomach and in descending duodenum. Remainder of the small bowel is unremarkable. The  appendix is normal. There are scattered colonic diverticula. There is a 5 cm segment of the proximal to mid ascending colon with circumferential wall thickening, moderate adjacent stranding greater medially, findings most likely due to ascending diverticulitis. There is an intramural rim enhancing abscess in the medial wall of the diseased segment measuring 1.5 x 1.5 x 1.4 cm (axial series 2, image 47, coronal reconstruction image 68). There is no extramural abscess or free air. Rest of the colon wall is normal in thickness. Follow-up colonoscopy is recommended after treatment to exclude underlying lesion. Vascular/Lymphatic: There are trace aortic atherosclerotic plaques and mild calcifications in the iliac arteries. There is left-greater-than-right pelvic venous congestion. There are no enlarged lymph nodes. Reproductive: Uterus and bilateral adnexa are unremarkable. Other: There is trace reactive nonlocalizing fluid in the right pericolic gutter posterior to the diseased inflamed segment of the ascending colon. There is no further free fluid. There is no free air, hemorrhage or abscess. Musculoskeletal: There is osteopenia with degenerative changes of the thoracic and lumbar spine. IMPRESSION: 1. 5 cm segment of proximal to mid ascending colon with circumferential wall thickening, moderate adjacent stranding, and a 1.5 x 1.5 x 1.4 cm intramural abscess. Findings most likely due to ascending diverticulitis. Colonoscopy is recommended after treatment to exclude underlying lesion. 2. Trace reactive fluid in the right paracolic gutter posterior to the diseased segment. No free air or extramural abscess. 3. Gastroenteritis without evidence of small-bowel obstruction or inflammation. 4. Mildly prominent liver with mild steatosis. 5. Aortoiliac atherosclerosis. 6. Left-greater-than-right pelvic venous congestion. 7. Lower lobe bronchitis. 8. Osteopenia and degenerative change. Electronically Signed   By: Almira Bar  M.D.   On: 02/08/2023 03:21    Anti-infectives: Anti-infectives (From admission, onward)    Start     Dose/Rate Route Frequency Ordered Stop   02/08/23 1200  piperacillin-tazobactam (ZOSYN) IVPB 3.375 g        3.375 g 12.5 mL/hr over 240 Minutes Intravenous Every 8 hours 02/08/23 0420     02/08/23 0345  piperacillin-tazobactam (ZOSYN) IVPB 3.375 g        3.375 g 100 mL/hr over 30 Minutes Intravenous  Once 02/08/23 0331 02/08/23 0409        Assessment/Plan Diverticulitis, small contained abscess to the right colon   Ct with 5 cm segment of proximal to mid ascending colon with circumferential wall thickening, moderate adjacent stranding, and a 1.5 x 1.5 x 1.4 cm intramural abscess - no acute surgical intervention indicated at this time - afebrile/24 hours. WBC improved - pain improved - start CLD. ADAT FLD - continue Iv abx  Hopefully continues to improve with abx. If she worsens then consider repeat CT to reassess abscess for possibility of drain. Otherwise continue abx and diet advancement and she will need colonoscopy in approx 6 weeks  FEN: CLD ID: [email or web address] VTE: lovenox  I reviewed last 24 h vitals and pain scores, last 48 h intake and output, last 24 h labs and trends, and last 24 h imaging results.    LOS: 1 day  Eric Form, [facility] Surgery 02/09/2023, 10:16 AM Please see Amion for pager number during day hours 7:00am-4:30pm

## 2023-02-09 NOTE — Progress Notes (Signed)
PROGRESS NOTE    Karen Lee  XBM:[id number] DOB: [birth year] DOA: 02/08/2023 PCP: Julieanne Manson, MD    Brief Narrative:  Karen Lee is a 67 y.o. female with medical history significant of oral aphthous ulcers, migraine who presented to the emergency department with progressively worse abdominal pain for the last week that particularly became more severe last night associated with several episodes of nausea and emesis. Found to have small intramural abscess.     Assessment and Plan:  Acute diverticulitis with intramural abscess Analgesics as needed. Antiemetics as needed. Pantoprazole 40 mg IVP every 24 hours. Continue Zosyn 3.375 g IVPB every 8 hours. -GS consult appreciated -improving- WBC normalized    Hyperglycemia -resolved     Hypocalcemia -replete   DVT prophylaxis: enoxaparin (LOVENOX) injection 40 mg Start: 02/08/23 2200    Code Status: Full Code Family Communication: at bedside  Disposition Plan:  Level of care: Med-Surg Status is: Inpatient Remains inpatient appropriate     Consultants:  GS   Subjective: Asking about eating + flatus  Objective: Vitals:   02/08/23 0828 02/08/23 1404 02/08/23 2115 02/09/23 0537  BP: 128/79 110/82 127/78 115/74  Pulse: 78 74 75 64  Resp: 16 14 17 18   Temp: 98.9 F (37.2 C) 99.2 F (37.3 C) 99.1 F (37.3 C) 98.5 F (36.9 C)  TempSrc: Oral Oral Oral Oral  SpO2: 97% 96% 96% 96%  Weight:      Height:        Intake/Output Summary (Last 24 hours) at 02/09/2023 1035 Last data filed at 02/09/2023 0272 Gross per 24 hour  Intake 1521.86 ml  Output 200 ml  Net 1321.86 ml   Filed Weights   02/08/23 0038  Weight: 59.4 kg    Examination:   General: Appearance:     Overweight female in no acute distress     Lungs:     respirations unlabored  Heart:    Normal heart rate. Normal rhythm. No murmurs, rubs, or gallops.    MS:   All extremities are intact.    Neurologic:   Awake, alert, oriented x 3. No apparent  focal neurological           defect.        Data Reviewed: I have personally reviewed following labs and imaging studies  CBC: Recent Labs  Lab 02/08/23 0114 02/09/23 0436  WBC 12.0* 7.8  NEUTROABS 8.7*  --   HGB 14.2 13.4  HCT 44.2 41.7  MCV 93.1 93.7  PLT 177 159   Basic Metabolic Panel: Recent Labs  Lab 02/08/23 0114 02/09/23 0436  NA 138 135  K 3.7 4.0  CL 103 102  CO2 25 25  GLUCOSE 131* 100*  BUN 10 12  CREATININE 0.80 0.98  CALCIUM 8.7* 8.5*   GFR: Estimated Creatinine Clearance: 39.5 mL/min (by C-G formula based on SCr of 0.98 mg/dL). Liver Function Tests: Recent Labs  Lab 02/08/23 0114 02/09/23 0436  AST 23 17  ALT 21 17  ALKPHOS 67 55  BILITOT 0.7 1.0  PROT 7.7 7.3  ALBUMIN 4.1 3.5   Recent Labs  Lab 02/08/23 0114  LIPASE 33   No results for input(s): "AMMONIA" in the last 168 hours. Coagulation Profile: No results for input(s): "INR", "PROTIME" in the last 168 hours. Cardiac Enzymes: No results for input(s): "CKTOTAL", "CKMB", "CKMBINDEX", "TROPONINI" in the last 168 hours. BNP (last 3 results) No results for input(s): "PROBNP" in the last 8760 hours. HbA1C: No results for input(s): "HGBA1C" in  the last 72 hours. CBG: No results for input(s): "GLUCAP" in the last 168 hours. Lipid Profile: No results for input(s): "CHOL", "HDL", "LDLCALC", "TRIG", "CHOLHDL", "LDLDIRECT" in the last 72 hours. Thyroid Function Tests: No results for input(s): "TSH", "T4TOTAL", "FREET4", "T3FREE", "THYROIDAB" in the last 72 hours. Anemia Panel: No results for input(s): "VITAMINB12", "FOLATE", "FERRITIN", "TIBC", "IRON", "RETICCTPCT" in the last 72 hours. Sepsis Labs: No results for input(s): "PROCALCITON", "LATICACIDVEN" in the last 168 hours.  No results found for this or any previous visit (from the past 240 hour(s)).       Radiology Studies: CT ABDOMEN PELVIS W CONTRAST  Result Date: 02/08/2023 CLINICAL DATA:  Abdominal pain. EXAM: CT ABDOMEN  AND PELVIS WITH CONTRAST TECHNIQUE: Multidetector CT imaging of the abdomen and pelvis was performed using the standard protocol following bolus administration of intravenous contrast. RADIATION DOSE REDUCTION: This exam was performed according to the departmental dose-optimization program which includes automated exposure control, adjustment of the mA and/or kV according to patient size and/or use of iterative reconstruction technique. CONTRAST:  OMNIPAQUE IOHEXOL 300 MG/ML  SOLN COMPARISON:  CT with IV and oral contrast 08/05/2006 FINDINGS: Lower chest: Lung bases show scattered linear scarring or atelectasis mildly elevated right hemidiaphragm. No infiltrate is seen. There is bronchial thickening in the lower lobes. The cardiac size is normal. Hepatobiliary: The liver is 18 cm length with mild general steatosis. Gallbladder bile ducts are unremarkable. Pancreas: No abnormality Spleen: No abnormality. Adrenals/Urinary Tract: There are few bilateral subcentimeter renal cortical hypodensities which are too small to characterize. No follow-up imaging is recommended [facility] 2018 Feb; 264-273, Management of the Incidental Renal Mass on CT, [employer] 2021; 814-848, Bosniak Classification of Cystic Renal Masses, Version 2019). There is no adrenal mass or renal mass enhancement. There is no urinary stone or obstruction. There is a normal bladder thickness. Stomach/Bowel: There are thickened folds in the stomach and in descending duodenum. Remainder of the small bowel is unremarkable. The appendix is normal. There are scattered colonic diverticula. There is a 5 cm segment of the proximal to mid ascending colon with circumferential wall thickening, moderate adjacent stranding greater medially, findings most likely due to ascending diverticulitis. There is an intramural rim enhancing abscess in the medial wall of the diseased segment measuring 1.5 x 1.5 x 1.4 cm (axial series 2, image 47, coronal reconstruction image  68). There is no extramural abscess or free air. Rest of the colon wall is normal in thickness. Follow-up colonoscopy is recommended after treatment to exclude underlying lesion. Vascular/Lymphatic: There are trace aortic atherosclerotic plaques and mild calcifications in the iliac arteries. There is left-greater-than-right pelvic venous congestion. There are no enlarged lymph nodes. Reproductive: Uterus and bilateral adnexa are unremarkable. Other: There is trace reactive nonlocalizing fluid in the right pericolic gutter posterior to the diseased inflamed segment of the ascending colon. There is no further free fluid. There is no free air, hemorrhage or abscess. Musculoskeletal: There is osteopenia with degenerative changes of the thoracic and lumbar spine. IMPRESSION: 1. 5 cm segment of proximal to mid ascending colon with circumferential wall thickening, moderate adjacent stranding, and a 1.5 x 1.5 x 1.4 cm intramural abscess. Findings most likely due to ascending diverticulitis. Colonoscopy is recommended after treatment to exclude underlying lesion. 2. Trace reactive fluid in the right paracolic gutter posterior to the diseased segment. No free air or extramural abscess. 3. Gastroenteritis without evidence of small-bowel obstruction or inflammation. 4. Mildly prominent liver with mild steatosis. 5. Aortoiliac atherosclerosis. 6. Left-greater-than-right  pelvic venous congestion. 7. Lower lobe bronchitis. 8. Osteopenia and degenerative change. Electronically Signed   By: Almira Bar M.D.   On: 02/08/2023 03:21        Scheduled Meds:  enoxaparin (LOVENOX) injection  40 mg Subcutaneous Q24H   pantoprazole (PROTONIX) IV  40 mg Intravenous Q24H   Continuous Infusions:  sodium chloride     piperacillin-tazobactam (ZOSYN)  IV 3.375 g (02/09/23 0320)     LOS: 1 day    Time spent: 45 minutes spent on chart review, discussion with nursing staff, consultants, updating family and interview/physical  exam; more than 50% of that time was spent in counseling and/or coordination of care.    Joseph Art, DO [facility] Available via Epic secure chat 7am-7pm After these hours, please refer to coverage provider listed on [email or web address] 02/09/2023, 10:35 AM

## 2023-02-09 NOTE — TOC CM/SW Note (Signed)
Transition of Care (TOC) Screening Note  Patient Details  Name: Karen Lee Date of Birth: 05/16/1956  Transition of Care [facility]) CM/SW Contact:    Ewing Schlein, LCSW Phone Number: 02/09/2023, 10:37 AM  Transition of Care Department [facility]) has reviewed patient and no TOC needs have been identified at this time. We will continue to monitor patient advancement through interdisciplinary progression rounds. If new patient transition needs arise, please place a TOC consult.

## 2023-02-10 ENCOUNTER — Other Ambulatory Visit (HOSPITAL_COMMUNITY): Payer: Self-pay

## 2023-02-10 LAB — CBC
HCT: 41.1 % (ref 36.0–46.0)
Hemoglobin: 13.3 g/dL (ref 12.0–15.0)
MCH: 30.2 pg (ref 26.0–34.0)
MCHC: 32.4 g/dL (ref 30.0–36.0)
MCV: 93.4 fL (ref 80.0–100.0)
Platelets: 166 10*3/uL (ref 150–400)
RBC: 4.4 MIL/uL (ref 3.87–5.11)
RDW: 11.9 % (ref 11.5–15.5)
WBC: 5.3 10*3/uL (ref 4.0–10.5)
nRBC: 0 % (ref 0.0–0.2)

## 2023-02-10 MED ORDER — PANTOPRAZOLE SODIUM 40 MG PO TBEC
40.0000 mg | DELAYED_RELEASE_TABLET | Freq: Every day | ORAL | Status: DC
  Administered 2023-02-10: 40 mg via ORAL
  Filled 2023-02-10: qty 1

## 2023-02-10 MED ORDER — AMOXICILLIN-POT CLAVULANATE 875-125 MG PO TABS: 1.0000 | ORAL_TABLET | Freq: Two times a day (BID) | ORAL | Status: DC

## 2023-02-10 MED ORDER — AMOXICILLIN-POT CLAVULANATE 875-125 MG PO TABS
1.0000 | ORAL_TABLET | Freq: Two times a day (BID) | ORAL | 0 refills | Status: AC
  Filled 2023-02-10: qty 14, 7d supply, fill #0

## 2023-02-10 MED ORDER — PANTOPRAZOLE SODIUM 40 MG PO TBEC
40.0000 mg | DELAYED_RELEASE_TABLET | Freq: Every day | ORAL | 0 refills | Status: DC
  Filled 2023-02-10: qty 30, 30d supply, fill #0

## 2023-02-10 NOTE — Progress Notes (Signed)
Mobility Specialist - Progress Note   02/10/23 0925  Mobility  Activity Ambulated with assistance in hallway  Level of Assistance Independent after set-up  Assistive Device Other (Comment) (IV Pole)  Distance Ambulated (ft) 460 ft  Activity Response Tolerated well  Mobility Referral Yes  $Mobility charge 1 Mobility  Mobility Specialist Start Time (ACUTE ONLY) [id number]  Mobility Specialist Stop Time (ACUTE ONLY) [id number]  Mobility Specialist Time Calculation (min) (ACUTE ONLY) 6 min   Pt received in bed and agreeable to mobility. No complaints during session. Pt to bathroom after session with all needs met.    [facility]

## 2023-02-10 NOTE — TOC Transition Note (Signed)
Transition of Care [facility]) - CM/SW Discharge Note  Patient Details  Name: Karen Lee MRN: [id number] Date of Birth: 02-05-1956  [facility] of [facility]) CM/SW Contact:  Ewing Schlein, LCSW Phone Number: 02/10/2023, 11:46 AM  Clinical Narrative: Patient's listed PCP is not correct, so TOC was consulted for PCP assistance. Son agreeable to hospital follow up appointment at a [facility] clinic. Patient will go to [facility] on Thursday Feb 19, 2023 at 9:30 am with Gwinda Passe. Patient's family updated and appointment added to AVS. TOC signing off.  Final next level of care: Home/Self Care Barriers to Discharge: Barriers Resolved  Patient Goals and CMS Choice Choice offered to / list presented to : NA  Discharge Plan and Services Additional resources added to the After Visit Summary for          DME Arranged: N/A DME Agency: NA  Social Determinants of Health (SDOH) Interventions SDOH Screenings   Food Insecurity: No Food Insecurity (02/08/2023)  Housing: Low Risk  (02/08/2023)  Transportation Needs: No Transportation Needs (02/08/2023)  Utilities: Not At Risk (02/08/2023)  Financial Resource Strain: Low Risk  (10/16/2017)  Physical Activity: Sufficiently Active (02/24/2018)  Stress: No Stress Concern Present (10/16/2017)  Tobacco Use: Low Risk  (02/08/2023)   Readmission Risk Interventions     No data to display

## 2023-02-10 NOTE — Discharge Summary (Signed)
Physician Discharge Summary  Karen Lee ZOX:[id number] DOB: December 19, 1955 DOA: 02/08/2023  PCP: Julieanne Manson, MD  Admit date: 02/08/2023 Discharge date: 02/10/2023  Admitted From: home Discharge disposition: home   Recommendations for Outpatient Follow-Up:   Colonoscopy in 6 weeks   Discharge Diagnosis:   Principal Problem:   Acute diverticulitis Active Problems:   Hyperglycemia   Hypocalcemia    Discharge Condition: Improved.  Diet recommendation: soft  Wound care: None.  Code status: Full.   History of Present Illness:   Karen Lee is a 67 y.o. female with medical history significant of oral aphthous ulcers, migraine who presented to the emergency department with progressively worse abdominal pain for the last week that particularly became more severe last night associated with several episodes of nausea and emesis.   No, diarrhea, constipation, melena or hematochezia.  No flank pain, dysuria, frequency or hematuria.No fever, chills or night sweats. No sore throat, rhinorrhea, dyspnea, wheezing or hemoptysis.  No chest pain, palpitations, diaphoresis, PND, orthopnea or pitting edema of the lower extremities.   No polyuria, polydipsia, polyphagia or blurred vision.   ED course: Initial vital signs were temperature 98.1 F, pulse 83, respiration 18, BP 162/90 mmHg O2 sat 94% on room air.  The patient received fentanyl 50 mcg IVP, morphine 4 mg IVP, ondansetron 4 mg IVP and was started on Zosyn every 8 hours.   Hospital Course by Problem:   Acute diverticulitis with intramural abscess Analgesics as needed./Antiemetics as needed.. - Zosyn-- change to PO abx to finish course -GS consult appreciated -improving- WBC normalized Home with plan for outpatient colonoscopy      Hyperglycemia -resolved     Hypocalcemia -outpatient follow up      Medical Consultants:    GS  Discharge Exam:   Vitals:   02/09/23 2047 02/10/23 0612  BP: 136/67 127/62   Pulse: (!) 57   Resp: 18 18  Temp: 98 F (36.7 C) 98 F (36.7 C)  SpO2: 95% 96%   Vitals:   02/09/23 0537 02/09/23 1343 02/09/23 2047 02/10/23 0612  BP: 115/74 120/64 136/67 127/62  Pulse: 64 (!) 57 (!) 57   Resp: 18 20 18 18   Temp: 98.5 F (36.9 C) 98.3 F (36.8 C) 98 F (36.7 C) 98 F (36.7 C)  TempSrc: Oral  Oral Oral  SpO2: 96% 94% 95% 96%  Weight:      Height:        General exam: Appears calm and comfortable.    The results of significant diagnostics from this hospitalization (including imaging, microbiology, ancillary and laboratory) are listed below for reference.     Procedures and Diagnostic Studies:   CT ABDOMEN PELVIS W CONTRAST  Result Date: 02/08/2023 CLINICAL DATA:  Abdominal pain. EXAM: CT ABDOMEN AND PELVIS WITH CONTRAST TECHNIQUE: Multidetector CT imaging of the abdomen and pelvis was performed using the standard protocol following bolus administration of intravenous contrast. RADIATION DOSE REDUCTION: This exam was performed according to the departmental dose-optimization program which includes automated exposure control, adjustment of the mA and/or kV according to patient size and/or use of iterative reconstruction technique. CONTRAST:  OMNIPAQUE IOHEXOL 300 MG/ML  SOLN COMPARISON:  CT with IV and oral contrast 08/05/2006 FINDINGS: Lower chest: Lung bases show scattered linear scarring or atelectasis mildly elevated right hemidiaphragm. No infiltrate is seen. There is bronchial thickening in the lower lobes. The cardiac size is normal. Hepatobiliary: The liver is 18 cm length with mild general steatosis. Gallbladder bile ducts  are unremarkable. Pancreas: No abnormality Spleen: No abnormality. Adrenals/Urinary Tract: There are few bilateral subcentimeter renal cortical hypodensities which are too small to characterize. No follow-up imaging is recommended [facility] 2018 Feb; 264-273, Management of the Incidental Renal Mass on CT, [employer] 2021; 814-848,  Bosniak Classification of Cystic Renal Masses, Version 2019). There is no adrenal mass or renal mass enhancement. There is no urinary stone or obstruction. There is a normal bladder thickness. Stomach/Bowel: There are thickened folds in the stomach and in descending duodenum. Remainder of the small bowel is unremarkable. The appendix is normal. There are scattered colonic diverticula. There is a 5 cm segment of the proximal to mid ascending colon with circumferential wall thickening, moderate adjacent stranding greater medially, findings most likely due to ascending diverticulitis. There is an intramural rim enhancing abscess in the medial wall of the diseased segment measuring 1.5 x 1.5 x 1.4 cm (axial series 2, image 47, coronal reconstruction image 68). There is no extramural abscess or free air. Rest of the colon wall is normal in thickness. Follow-up colonoscopy is recommended after treatment to exclude underlying lesion. Vascular/Lymphatic: There are trace aortic atherosclerotic plaques and mild calcifications in the iliac arteries. There is left-greater-than-right pelvic venous congestion. There are no enlarged lymph nodes. Reproductive: Uterus and bilateral adnexa are unremarkable. Other: There is trace reactive nonlocalizing fluid in the right pericolic gutter posterior to the diseased inflamed segment of the ascending colon. There is no further free fluid. There is no free air, hemorrhage or abscess. Musculoskeletal: There is osteopenia with degenerative changes of the thoracic and lumbar spine. IMPRESSION: 1. 5 cm segment of proximal to mid ascending colon with circumferential wall thickening, moderate adjacent stranding, and a 1.5 x 1.5 x 1.4 cm intramural abscess. Findings most likely due to ascending diverticulitis. Colonoscopy is recommended after treatment to exclude underlying lesion. 2. Trace reactive fluid in the right paracolic gutter posterior to the diseased segment. No free air or extramural  abscess. 3. Gastroenteritis without evidence of small-bowel obstruction or inflammation. 4. Mildly prominent liver with mild steatosis. 5. Aortoiliac atherosclerosis. 6. Left-greater-than-right pelvic venous congestion. 7. Lower lobe bronchitis. 8. Osteopenia and degenerative change. Electronically Signed   By: Almira Bar M.D.   On: 02/08/2023 03:21     Labs:   Basic Metabolic Panel: Recent Labs  Lab 02/08/23 0114 02/09/23 0436  NA 138 135  K 3.7 4.0  CL 103 102  CO2 25 25  GLUCOSE 131* 100*  BUN 10 12  CREATININE 0.80 0.98  CALCIUM 8.7* 8.5*   GFR Estimated Creatinine Clearance: 39.5 mL/min (by C-G formula based on SCr of 0.98 mg/dL). Liver Function Tests: Recent Labs  Lab 02/08/23 0114 02/09/23 0436  AST 23 17  ALT 21 17  ALKPHOS 67 55  BILITOT 0.7 1.0  PROT 7.7 7.3  ALBUMIN 4.1 3.5   Recent Labs  Lab 02/08/23 0114  LIPASE 33   No results for input(s): "AMMONIA" in the last 168 hours. Coagulation profile No results for input(s): "INR", "PROTIME" in the last 168 hours.  CBC: Recent Labs  Lab 02/08/23 0114 02/09/23 0436 02/10/23 0451  WBC 12.0* 7.8 5.3  NEUTROABS 8.7*  --   --   HGB 14.2 13.4 13.3  HCT 44.2 41.7 41.1  MCV 93.1 93.7 93.4  PLT 177 159 166   Cardiac Enzymes: No results for input(s): "CKTOTAL", "CKMB", "CKMBINDEX", "TROPONINI" in the last 168 hours. BNP: Invalid input(s): "POCBNP" CBG: No results for input(s): "GLUCAP" in the last 168 hours.  D-Dimer No results for input(s): "DDIMER" in the last 72 hours. Hgb A1c No results for input(s): "HGBA1C" in the last 72 hours. Lipid Profile No results for input(s): "CHOL", "HDL", "LDLCALC", "TRIG", "CHOLHDL", "LDLDIRECT" in the last 72 hours. Thyroid function studies No results for input(s): "TSH", "T4TOTAL", "T3FREE", "THYROIDAB" in the last 72 hours.  Invalid input(s): "FREET3" Anemia work up No results for input(s): "VITAMINB12", "FOLATE", "FERRITIN", "TIBC", "IRON", "RETICCTPCT" in  the last 72 hours. Microbiology No results found for this or any previous visit (from the past 240 hour(s)).   Discharge Instructions:   Discharge Instructions     Ambulatory referral to Gastroenterology   Complete by: As directed    What is the reason for referral?: Colonoscopy   Discharge instructions   Complete by: As directed    Soft diet Finish antibiotics Will need outpatient colonoscopy in 6 weeks   Increase activity slowly   Complete by: As directed       Allergies as of 02/10/2023   No Known Allergies      Medication List     STOP taking these medications    LORazepam 1 MG tablet Commonly known as: Ativan   omeprazole 20 MG capsule Commonly known as: PRILOSEC       TAKE these medications    amoxicillin-clavulanate 875-125 MG tablet Commonly known as: AUGMENTIN Take 1 tablet by mouth every 12 (twelve) hours.   pantoprazole 40 MG tablet Commonly known as: PROTONIX Take 1 tablet (40 mg total) by mouth daily. Start taking on: Feb 11, 2023        Follow-up Information     Julieanne Manson, MD Follow up.   Specialty: Internal Medicine Contact information: [address] [place] [address] [phone]                  Time coordinating discharge: 45 min  Signed:  Joseph Art DO  [facility] 02/10/2023, 11:33 AM

## 2023-02-10 NOTE — Progress Notes (Signed)
Progress Note     Subjective: Continues to feel well. No abdominal pain with full liquids. Having some upper abdominal pain she is attributing to hunger. Still passing flatus but no BM. No n/v  Interpretor offered and declined - son at bedside serves as interpretor  Objective: Vital signs in last 24 hours: Temp:  [98 F (36.7 C)-98.3 F (36.8 C)] 98 F (36.7 C) (05/21 0612) Pulse Rate:  [57] 57 (05/20 2047) Resp:  [18-20] 18 (05/21 0612) BP: (120-136)/(62-67) 127/62 (05/21 0612) SpO2:  [94 %-96 %] 96 % (05/21 0612) Last BM Date :  (unknown)  Intake/Output from previous day: 05/20 0701 - 05/21 0700 In: 2080 [P.O.:720; I.V.:1192.3; IV Piggyback:167.8] Out: 400 [Urine:400] Intake/Output this shift: No intake/output data recorded.  PE: General: pleasant, WD, female who is laying in bed in NAD Lungs: Respiratory effort nonlabored on room air Abd: soft, ND, no masses, hernias, or organomegaly. Nontender to palpation MSK: all 4 extremities are symmetrical with no cyanosis, clubbing, or edema. Skin: warm and dry with no masses, lesions, or rashes Psych: A&Ox3 with an appropriate affect.    Lab Results:  Recent Labs    02/09/23 0436 02/10/23 0451  WBC 7.8 5.3  HGB 13.4 13.3  HCT 41.7 41.1  PLT 159 166    BMET Recent Labs    02/08/23 0114 02/09/23 0436  NA 138 135  K 3.7 4.0  CL 103 102  CO2 25 25  GLUCOSE 131* 100*  BUN 10 12  CREATININE 0.80 0.98  CALCIUM 8.7* 8.5*    PT/INR No results for input(s): "LABPROT", "INR" in the last 72 hours. CMP     Component Value Date/Time   NA 135 02/09/2023 0436   NA 141 10/16/2017 1312   K 4.0 02/09/2023 0436   CL 102 02/09/2023 0436   CO2 25 02/09/2023 0436   GLUCOSE 100 (H) 02/09/2023 0436   BUN 12 02/09/2023 0436   BUN 15 10/16/2017 1312   CREATININE 0.98 02/09/2023 0436   CALCIUM 8.5 (L) 02/09/2023 0436   PROT 7.3 02/09/2023 0436   PROT 7.9 02/24/2018 1242   ALBUMIN 3.5 02/09/2023 0436   ALBUMIN 5.0 (H)  02/24/2018 1242   AST 17 02/09/2023 0436   ALT 17 02/09/2023 0436   ALKPHOS 55 02/09/2023 0436   BILITOT 1.0 02/09/2023 0436   BILITOT 0.4 02/24/2018 1242   GFRNONAA >60 02/09/2023 0436   GFRAA 57 (L) 12/16/2018 0122   Lipase     Component Value Date/Time   LIPASE 33 02/08/2023 0114       Studies/Results: No results found.  Anti-infectives: Anti-infectives (From admission, onward)    Start     Dose/Rate Route Frequency Ordered Stop   02/08/23 1200  piperacillin-tazobactam (ZOSYN) IVPB 3.375 g        3.375 g 12.5 mL/hr over 240 Minutes Intravenous Every 8 hours 02/08/23 0420     02/08/23 0345  piperacillin-tazobactam (ZOSYN) IVPB 3.375 g        3.375 g 100 mL/hr over 30 Minutes Intravenous  Once 02/08/23 0331 02/08/23 0409        Assessment/Plan Diverticulitis, small contained abscess to the right colon   Ct with 5 cm segment of proximal to mid ascending colon with circumferential wall thickening, moderate adjacent stranding, and a 1.5 x 1.5 x 1.4 cm intramural abscess - no acute surgical intervention indicated at this time - afebrile/24 hours. WBC normal - pain resolved on FLD. Advance to soft - if tolerates soft diet  stable for dc on PO abx with GI and PCP follow up. Recommend 10 more days po augmentin  FEN: soft ID: [email or web address] VTE: lovenox  I reviewed last 24 h vitals and pain scores, last 48 h intake and output, last 24 h labs and trends, and last 24 h imaging results.    LOS: 2 days   Eric Form, [facility] 02/10/2023, 9:36 AM Please see Amion for pager number during day hours 7:00am-4:30pm

## 2023-02-10 NOTE — Discharge Instructions (Signed)
Continue to keep bowel movements regular and use stool softeners and laxatives as need for regular bowel movements. Follow up with PCP and GI - you need a colonoscopy in approximately 6 weeks.

## 2023-02-10 NOTE — Progress Notes (Signed)
Patient and family member was given discharge instructions with interpreter, Hji Rmah, and all questions were answered. Patient was stable for discharge and was taken to the main exit by wheelchair.   Patient's medications were delivered to room by [facility].

## 2023-02-19 ENCOUNTER — Ambulatory Visit (INDEPENDENT_AMBULATORY_CARE_PROVIDER_SITE_OTHER): Payer: Self-pay | Admitting: Primary Care

## 2023-02-19 ENCOUNTER — Encounter (INDEPENDENT_AMBULATORY_CARE_PROVIDER_SITE_OTHER): Payer: Self-pay | Admitting: Primary Care

## 2023-02-19 VITALS — BP 152/78 | HR 72 | Resp 16 | Wt 128.0 lb

## 2023-02-19 DIAGNOSIS — Z7689 Persons encountering health services in other specified circumstances: Secondary | ICD-10-CM

## 2023-02-19 DIAGNOSIS — K5792 Diverticulitis of intestine, part unspecified, without perforation or abscess without bleeding: Secondary | ICD-10-CM

## 2023-02-19 DIAGNOSIS — R739 Hyperglycemia, unspecified: Secondary | ICD-10-CM

## 2023-02-19 DIAGNOSIS — R7303 Prediabetes: Secondary | ICD-10-CM

## 2023-02-19 DIAGNOSIS — R030 Elevated blood-pressure reading, without diagnosis of hypertension: Secondary | ICD-10-CM

## 2023-02-19 DIAGNOSIS — Z09 Encounter for follow-up examination after completed treatment for conditions other than malignant neoplasm: Secondary | ICD-10-CM

## 2023-02-19 LAB — POCT GLYCOSYLATED HEMOGLOBIN (HGB A1C): HbA1c, POC (prediabetic range): 5.9 % (ref 5.7–6.4)

## 2023-02-19 MED ORDER — PANTOPRAZOLE SODIUM 40 MG PO TBEC
40.0000 mg | DELAYED_RELEASE_TABLET | Freq: Every day | ORAL | 1 refills | Status: AC
Start: 2023-02-19 — End: ?

## 2023-02-19 NOTE — Patient Instructions (Addendum)
Prediabetes Prediabetes is when your blood sugar (blood glucose) level is higher than normal but not high enough for you to be diagnosed with type 2 diabetes. Having prediabetes puts you at risk for developing type 2 diabetes (type 2 diabetes mellitus). With certain lifestyle changes, you may be able to prevent or delay the onset of type 2 diabetes. This is important because type 2 diabetes can lead to serious complications, such as: Heart disease. Stroke. Blindness. Kidney disease. Depression. Poor circulation in the feet and legs. In severe cases, this could lead to surgical removal of a leg (amputation). What are the causes? The exact cause of prediabetes is not known. It may result from insulin resistance. Insulin resistance develops when cells in the body do not respond properly to insulin that the body makes. This can cause excess glucose to build up in the blood. High blood glucose (hyperglycemia) can develop. What increases the risk? The following factors may make you more likely to develop this condition: You have a family member with type 2 diabetes. You are older than 45 years. You had a temporary form of diabetes during a pregnancy (gestational diabetes). You had polycystic ovary syndrome (PCOS). You are overweight or obese. You are inactive (sedentary). You have a history of heart disease, including problems with cholesterol levels, high levels of blood fats, or high blood pressure. What are the signs or symptoms? You may have no symptoms. If you do have symptoms, they may include: Increased hunger. Increased thirst. Increased urination. Vision changes, such as blurry vision. Tiredness (fatigue). How is this diagnosed? This condition can be diagnosed with blood tests. Your blood glucose may be checked with one or more of the following tests: A fasting blood glucose (FBG) test. You will not be allowed to eat (you will fast) for at least 8 hours before a blood sample is  taken. An A1C blood test (hemoglobin A1C). This test provides information about blood glucose levels over the previous 2?3 months. An oral glucose tolerance test (OGTT). This test measures your blood glucose at two points in time: After fasting. This is your baseline level. Two hours after you drink a beverage that contains glucose. You may be diagnosed with prediabetes if: Your FBG is 100?125 mg/dL (1.6-1.0 mmol/L). Your A1C level is 5.7?6.4% (39-46 mmol/mol). Your OGTT result is 140?199 mg/dL (9.6-04 mmol/L). These blood tests may be repeated to confirm your diagnosis. How is this treated? Treatment may include dietary and lifestyle changes to help lower your blood glucose and prevent type 2 diabetes from developing. In some cases, medicine may be prescribed to help lower the risk of type 2 diabetes. Follow these instructions at home: Nutrition  Follow a healthy meal plan. This includes eating lean proteins, whole grains, legumes, fresh fruits and vegetables, low-fat dairy products, and healthy fats. Follow instructions from your health care provider about eating or drinking restrictions. Meet with a dietitian to create a healthy eating plan that is right for you. Lifestyle Do moderate-intensity exercise for at least 30 minutes a day on 5 or more days each week, or as told by your health care provider. A mix of activities may be best, such as: Brisk walking, swimming, biking, and weight lifting. Lose weight as told by your health care provider. Losing 5-7% of your body weight can reverse insulin resistance. Do not drink alcohol if: Your health care provider tells you not to drink. You are pregnant, may be pregnant, or are planning to become pregnant. If you drink alcohol:  Limit how much you use to: 0-1 drink a day for women. 0-2 drinks a day for men. Be aware of how much alcohol is in your drink. In the [place] one drink equals one 12 oz bottle of beer (355 mL), one 5 oz glass of wine  (148 mL), or one 1 oz glass of hard liquor (44 mL). General instructions Take over-the-counter and prescription medicines only as told by your health care provider. You may be prescribed medicines that help lower the risk of type 2 diabetes. Do not use any products that contain nicotine or tobacco, such as cigarettes, e-cigarettes, and chewing tobacco. If you need help quitting, ask your health care provider. Keep all follow-up visits. This is important. Where to find more information American Diabetes Association: www.diabetes.org Academy of Nutrition and Dietetics: www.eatright.org American Heart Association: www.heart.org Contact a health care provider if: You have any of these symptoms: Increased hunger. Increased urination. Increased thirst. Fatigue. Vision changes, such as blurry vision. Get help right away if you: Have shortness of breath. Feel confused. Vomit or feel like you may vomit. Summary Prediabetes is when your blood sugar (blood glucose)level is higher than normal but not high enough for you to be diagnosed with type 2 diabetes. Having prediabetes puts you at risk for developing type 2 diabetes (type 2 diabetes mellitus). Make lifestyle changes such as eating a healthy diet and exercising regularly to help prevent diabetes. Lose weight as told by your health care provider. This information is not intended to replace advice given to you by your health care provider. Make sure you discuss any questions you have with your health care provider. Document Revised: 12/08/2019 Document Reviewed: 12/08/2019 Elsevier Patient Education  2024 [employer] Hypertension, Adult High blood pressure (hypertension) is when the force of blood pumping through the arteries is too strong. The arteries are the blood vessels that carry blood from the heart throughout the body. Hypertension forces the heart to work harder to pump blood and may cause arteries to become narrow or stiff.  Untreated or uncontrolled hypertension can lead to a heart attack, heart failure, a stroke, kidney disease, and other problems. A blood pressure reading consists of a higher number over a lower number. Ideally, your blood pressure should be below 120/80. The first ("top") number is called the systolic pressure. It is a measure of the pressure in your arteries as your heart beats. The second ("bottom") number is called the diastolic pressure. It is a measure of the pressure in your arteries as the heart relaxes. What are the causes? The exact cause of this condition is not known. There are some conditions that result in high blood pressure. What increases the risk? Certain factors may make you more likely to develop high blood pressure. Some of these risk factors are under your control, including: Smoking. Not getting enough exercise or physical activity. Being overweight. Having too much fat, sugar, calories, or salt (sodium) in your diet. Drinking too much alcohol. Other risk factors include: Having a personal history of heart disease, diabetes, high cholesterol, or kidney disease. Stress. Having a family history of high blood pressure and high cholesterol. Having obstructive sleep apnea. Age. The risk increases with age. What are the signs or symptoms? High blood pressure may not cause symptoms. Very high blood pressure (hypertensive crisis) may cause: Headache. Fast or irregular heartbeats (palpitations). Shortness of breath. Nosebleed. Nausea and vomiting. Vision changes. Severe chest pain, dizziness, and seizures. How is this diagnosed? This condition is diagnosed by  measuring your blood pressure while you are seated, with your arm resting on a flat surface, your legs uncrossed, and your feet flat on the floor. The cuff of the blood pressure monitor will be placed directly against the skin of your upper arm at the level of your heart. Blood pressure should be measured at least twice  using the same arm. Certain conditions can cause a difference in blood pressure between your right and left arms. If you have a high blood pressure reading during one visit or you have normal blood pressure with other risk factors, you may be asked to: Return on a different day to have your blood pressure checked again. Monitor your blood pressure at home for 1 week or longer. If you are diagnosed with hypertension, you may have other blood or imaging tests to help your health care provider understand your overall risk for other conditions. How is this treated? This condition is treated by making healthy lifestyle changes, such as eating healthy foods, exercising more, and reducing your alcohol intake. You may be referred for counseling on a healthy diet and physical activity. Your health care provider may prescribe medicine if lifestyle changes are not enough to get your blood pressure under control and if: Your systolic blood pressure is above 130. Your diastolic blood pressure is above 80. Your personal target blood pressure may vary depending on your medical conditions, your age, and other factors. Follow these instructions at home: Eating and drinking  Eat a diet that is high in fiber and potassium, and low in sodium, added sugar, and fat. An example of this eating plan is called the DASH diet. DASH stands for Dietary Approaches to Stop Hypertension. To eat this way: Eat plenty of fresh fruits and vegetables. Try to fill one half of your plate at each meal with fruits and vegetables. Eat whole grains, such as whole-wheat pasta, brown rice, or whole-grain bread. Fill about one fourth of your plate with whole grains. Eat or drink low-fat dairy products, such as skim milk or low-fat yogurt. Avoid fatty cuts of meat, processed or cured meats, and poultry with skin. Fill about one fourth of your plate with lean proteins, such as fish, chicken without skin, beans, eggs, or tofu. Avoid pre-made and  processed foods. These tend to be higher in sodium, added sugar, and fat. Reduce your daily sodium intake. Many people with hypertension should eat less than 1,500 mg of sodium a day. Do not drink alcohol if: Your health care provider tells you not to drink. You are pregnant, may be pregnant, or are planning to become pregnant. If you drink alcohol: Limit how much you have to: 0-1 drink a day for women. 0-2 drinks a day for men. Know how much alcohol is in your drink. In the [place] one drink equals one 12 oz bottle of beer (355 mL), one 5 oz glass of wine (148 mL), or one 1 oz glass of hard liquor (44 mL). Lifestyle  Work with your health care provider to maintain a healthy body weight or to lose weight. Ask what an ideal weight is for you. Get at least 30 minutes of exercise that causes your heart to beat faster (aerobic exercise) most days of the week. Activities may include walking, swimming, or biking. Include exercise to strengthen your muscles (resistance exercise), such as Pilates or lifting weights, as part of your weekly exercise routine. Try to do these types of exercises for 30 minutes at least 3 days a week.  Do not use any products that contain nicotine or tobacco. These products include cigarettes, chewing tobacco, and vaping devices, such as e-cigarettes. If you need help quitting, ask your health care provider. Monitor your blood pressure at home as told by your health care provider. Keep all follow-up visits. This is important. Medicines Take over-the-counter and prescription medicines only as told by your health care provider. Follow directions carefully. Blood pressure medicines must be taken as prescribed. Do not skip doses of blood pressure medicine. Doing this puts you at risk for problems and can make the medicine less effective. Ask your health care provider about side effects or reactions to medicines that you should watch for. Contact a health care provider if  you: Think you are having a reaction to a medicine you are taking. Have headaches that keep coming back (recurring). Feel dizzy. Have swelling in your ankles. Have trouble with your vision. Get help right away if you: Develop a severe headache or confusion. Have unusual weakness or numbness. Feel faint. Have severe pain in your chest or abdomen. Vomit repeatedly. Have trouble breathing. These symptoms may be an emergency. Get help right away. Call 911. Do not wait to see if the symptoms will go away. Do not drive yourself to the hospital. Summary Hypertension is when the force of blood pumping through your arteries is too strong. If this condition is not controlled, it may put you at risk for serious complications. Your personal target blood pressure may vary depending on your medical conditions, your age, and other factors. For most people, a normal blood pressure is less than 120/80. Hypertension is treated with lifestyle changes, medicines, or a combination of both. Lifestyle changes include losing weight, eating a healthy, low-sodium diet, exercising more, and limiting alcohol. This information is not intended to replace advice given to you by your health care provider. Make sure you discuss any questions you have with your health care provider. Document Revised: 07/16/2021 Document Reviewed: 07/16/2021 Elsevier Patient Education  2024 [employer].

## 2023-02-19 NOTE — Progress Notes (Signed)
[facility]   Subjective:   Karen Lee is a 67 y.o. Karen Lee female(patient has given permission for her daughter Karen Lee to be present at her appointment and interpret for her.Patient presents for hospital follow up and establish care.  She presented to the emergency room she was brought in by her son with progressively worse abdominal pain for which became more severe .She also had several episodes of nausea and emesis. Admit date to the hospital was 02/08/23, patient was discharged from the hospital on 02/10/23, patient was admitted for: Acute diverticulitis, hyperglycemia and hypocalcemia.  Hospital course included antibiotics and fluids. She was discharged on oral antibiotics and PPI. She is feeling good. Past Medical History:  Diagnosis Date   H/O oral aphthous ulcers 2018   Migraine      No Known Allergies    Current Outpatient Medications on File Prior to Visit  Medication Sig Dispense Refill   amoxicillin-clavulanate (AUGMENTIN) 875-125 MG tablet Take 1 tablet by mouth every 12 (twelve) hours. 14 tablet 0   No current facility-administered medications on file prior to visit.   Review of System: Comprehensive ROS Pertinent positive and negative noted in HPI   Objective:  Blood Pressure (Abnormal) 152/78 (BP Location: Right Arm, Patient Position: Sitting, Cuff Size: Normal)   Pulse 72   Respiration 16   Weight 128 lb (58.1 kg)   Oxygen Saturation 99%   Body Mass Index 29.22 kg/m   Filed Weights   02/19/23 0925  Weight: 128 lb (58.1 kg)    Physical Exam: General Appearance: Well nourished, over weight female in no apparent distress. Eyes: PERRLA, EOMs, conjunctiva no swelling or erythema Sinuses: No Frontal/maxillary tenderness ENT/Mouth: Ext aud canals clear, TMs without erythema, bulging. No erythema, swelling, or exudate on post pharynx.  Tonsils not swollen or erythematous. Hearing normal.  Neck: Supple, thyroid normal.  Respiratory:  Respiratory effort normal, BS equal bilaterally without rales, rhonchi, wheezing or stridor.  Cardio: RRR with no MRGs. Brisk peripheral pulses without edema.  Abdomen: Soft, + BS.  Non tender, no guarding, rebound, hernias, masses. Lymphatics: Non tender without lymphadenopathy.  Musculoskeletal: Full ROM, 5/5 strength, normal gait.  Skin: Warm, dry without rashes, lesions, ecchymosis.  Neuro: Cranial nerves intact. Normal muscle tone, no cerebellar symptoms. Sensation intact.  Psych: Awake and oriented X 3, normal affect, Insight and Judgment appropriate.    Assessment:  Karen Lee was seen today for hospitalization follow-up.  Diagnoses and all orders for this visit:  Hyperglycemia 2/2 Prediabetes -     POCT glycosylated hemoglobin (Hb A1C) Explained you are prediabetic .  Prediabetes is 5.7-6.4 monitor carbohydrates -rice, potatoes, tortillas, breads, pasta, sweets, sodas.  Increase exercising to help maintain appropriate weight.   Encounter to establish care  Hospital discharge follow-up -     Ambulatory referral to Gastroenterology- for colonoscopy 6 weeks  Establish care with PCP   Elevated blood pressure reading We have discussed target BP range and blood pressure goal. We discussed the importance of compliance of DASH diet recommended, consequences of uncontrolled hypertension discussed.  SEE AVS  Acute diverticulitis -     pantoprazole (PROTONIX) 40 MG tablet; Take 1 tablet (40 mg total) by mouth daily. -     Ambulatory referral to Gastroenterology     Meds ordered this encounter  Medications   pantoprazole (PROTONIX) 40 MG tablet    Sig: Take 1 tablet (40 mg total) by mouth daily.    Dispense:  30 tablet  Refill:  1    Order Specific Question:   Supervising Provider    Answer:   Quentin Angst [[id number]]    This note has been created with Dragon speech recognition software and smart phrase technology. Any transcriptional errors are unintentional.   Grayce Sessions, NP 02/19/2023, 4:00 PM

## 2023-04-06 ENCOUNTER — Ambulatory Visit (INDEPENDENT_AMBULATORY_CARE_PROVIDER_SITE_OTHER): Payer: Self-pay | Admitting: Primary Care

## 2024-06-15 ENCOUNTER — Other Ambulatory Visit (HOSPITAL_COMMUNITY): Payer: Self-pay
